# Patient Record
Sex: Female | Born: 1970 | Race: White | Hispanic: No | Marital: Married | Smoking: Current every day smoker
Health system: Southern US, Community
[De-identification: ages and names within clinical notes are randomized; demographics above are authoritative.]

## PROBLEM LIST (undated history)

## (undated) DIAGNOSIS — F419 Anxiety disorder, unspecified: Secondary | ICD-10-CM

## (undated) DIAGNOSIS — J449 Chronic obstructive pulmonary disease, unspecified: Secondary | ICD-10-CM

## (undated) DIAGNOSIS — J984 Other disorders of lung: Secondary | ICD-10-CM

## (undated) DIAGNOSIS — Z9981 Dependence on supplemental oxygen: Secondary | ICD-10-CM

## (undated) DIAGNOSIS — K254 Chronic or unspecified gastric ulcer with hemorrhage: Secondary | ICD-10-CM

## (undated) DIAGNOSIS — R0602 Shortness of breath: Secondary | ICD-10-CM

## (undated) DIAGNOSIS — D869 Sarcoidosis, unspecified: Secondary | ICD-10-CM

## (undated) HISTORY — PX: COLONOSCOPY: SHX174

## (undated) HISTORY — PX: LUNG BIOPSY: SHX232

---

## 2001-06-23 HISTORY — PX: ABDOMINAL HYSTERECTOMY: SHX81

## 2006-06-23 HISTORY — PX: BACK SURGERY: SHX140

## 2006-06-23 HISTORY — PX: TONSILLECTOMY: SUR1361

## 2011-06-24 DIAGNOSIS — K254 Chronic or unspecified gastric ulcer with hemorrhage: Secondary | ICD-10-CM

## 2011-06-24 HISTORY — DX: Chronic or unspecified gastric ulcer with hemorrhage: K25.4

## 2011-11-23 ENCOUNTER — Encounter (HOSPITAL_COMMUNITY): Payer: Self-pay | Admitting: *Deleted

## 2011-11-23 ENCOUNTER — Emergency Department (HOSPITAL_COMMUNITY): Payer: Medicaid Other

## 2011-11-23 ENCOUNTER — Emergency Department (HOSPITAL_COMMUNITY)
Admission: EM | Admit: 2011-11-23 | Discharge: 2011-11-23 | Disposition: A | Discharge status: Home / Self Care | Payer: Medicaid Other | Attending: Emergency Medicine | Admitting: Emergency Medicine

## 2011-11-23 DIAGNOSIS — R079 Chest pain, unspecified: Secondary | ICD-10-CM | POA: Insufficient documentation

## 2011-11-23 DIAGNOSIS — J984 Other disorders of lung: Secondary | ICD-10-CM | POA: Insufficient documentation

## 2011-11-23 DIAGNOSIS — Z9981 Dependence on supplemental oxygen: Secondary | ICD-10-CM | POA: Insufficient documentation

## 2011-11-23 DIAGNOSIS — J4489 Other specified chronic obstructive pulmonary disease: Secondary | ICD-10-CM | POA: Insufficient documentation

## 2011-11-23 DIAGNOSIS — J449 Chronic obstructive pulmonary disease, unspecified: Secondary | ICD-10-CM | POA: Insufficient documentation

## 2011-11-23 DIAGNOSIS — R0602 Shortness of breath: Secondary | ICD-10-CM | POA: Insufficient documentation

## 2011-11-23 DIAGNOSIS — D869 Sarcoidosis, unspecified: Secondary | ICD-10-CM | POA: Insufficient documentation

## 2011-11-23 HISTORY — DX: Sarcoidosis, unspecified: D86.9

## 2011-11-23 HISTORY — DX: Dependence on supplemental oxygen: Z99.81

## 2011-11-23 HISTORY — DX: Chronic obstructive pulmonary disease, unspecified: J44.9

## 2011-11-23 HISTORY — DX: Anxiety disorder, unspecified: F41.9

## 2011-11-23 HISTORY — DX: Other disorders of lung: J98.4

## 2011-11-23 LAB — DIFFERENTIAL
Basophils Relative: 0 % (ref 0–1)
Eosinophils Absolute: 0 10*3/uL (ref 0.0–0.7)
Eosinophils Relative: 0 % (ref 0–5)
Monocytes Relative: 3 % (ref 3–12)
Neutrophils Relative %: 84 % — ABNORMAL HIGH (ref 43–77)

## 2011-11-23 LAB — BASIC METABOLIC PANEL
BUN: 15 mg/dL (ref 6–23)
Calcium: 9.3 mg/dL (ref 8.4–10.5)
GFR calc Af Amer: >90 mL/min (ref >90)
GFR calc non Af Amer: >90 mL/min (ref >90)
Potassium: 4.3 mEq/L (ref 3.5–5.1)

## 2011-11-23 LAB — CBC
Hemoglobin: 13.2 g/dL (ref 12.0–15.0)
MCH: 31.7 pg (ref 26.0–34.0)
MCHC: 32.8 g/dL (ref 30.0–36.0)
Platelets: 522 10*3/uL — ABNORMAL HIGH (ref 150–400)

## 2011-11-23 MED ORDER — IPRATROPIUM BROMIDE 0.02 % IN SOLN
0.5000 mg | Freq: Once | RESPIRATORY_TRACT | Status: AC
  Administered 2011-11-23: 0.5 mg via RESPIRATORY_TRACT
  Filled 2011-11-23: qty 2.5

## 2011-11-23 MED ORDER — PREDNISONE 20 MG PO TABS
40.0000 mg | ORAL_TABLET | Freq: Once | ORAL | Status: AC
  Administered 2011-11-23: 40 mg via ORAL
  Filled 2011-11-23: qty 2

## 2011-11-23 MED ORDER — ALBUTEROL SULFATE (5 MG/ML) 0.5% IN NEBU
5.0000 mg | INHALATION_SOLUTION | Freq: Once | RESPIRATORY_TRACT | Status: AC
  Administered 2011-11-23: 5 mg via RESPIRATORY_TRACT
  Filled 2011-11-23: qty 1

## 2011-11-23 MED ORDER — SODIUM CHLORIDE 0.9 % IV SOLN
INTRAVENOUS | Status: DC
  Administered 2011-11-23: 17:00:00 via INTRAVENOUS

## 2011-11-23 MED ORDER — PREDNISONE 20 MG PO TABS: 60.0000 mg | ORAL_TABLET | Freq: Every day | ORAL | Status: DC

## 2011-11-23 NOTE — ED Notes (Signed)
Pt c/o cough and sob for a few days. Pt states she believes this is a flare up of her chronic lung condition.

## 2011-11-23 NOTE — Discharge Instructions (Signed)
Chronic Obstructive Pulmonary Disease Chronic obstructive pulmonary disease (COPD) is a condition in which airflow from the lungs is restricted. The lungs can never return to normal, but there are measures you can take which will improve them and make you feel better. CAUSES   Smoking.   Exposure to secondhand smoke.   Breathing in irritants (pollution, cigarette smoke, strong smells, aerosol sprays, paint fumes).   History of lung infections.  TREATMENT  Treatment focuses on making you comfortable (supportive care). Your caregiver may prescribe medications (inhaled or pills) to help improve your breathing. HOME CARE INSTRUCTIONS   If you smoke, stop smoking.   Avoid exposure to smoke, chemicals, and fumes that aggravate your breathing.   Take antibiotic medicines as directed by your caregiver.   Avoid medicines that dry up your system and slow down the elimination of secretions (antihistamines and cough syrups). This decreases respiratory capacity and may lead to infections.   Drink enough water and fluids to keep your urine clear or pale yellow. This loosens secretions.   Use humidifiers at home and at your bedside if they do not make breathing difficult.   Receive all protective vaccines your caregiver suggests, especially pneumococcal and influenza.   Use home oxygen as suggested.   Stay active. Exercise and physical activity will help maintain your ability to do things you want to do.   Eat a healthy diet.  SEEK MEDICAL CARE IF:   You develop pus-like mucus (sputum).   Breathing is more labored or exercise becomes difficult to do.   You are running out of the medicine you take for your breathing.  SEEK IMMEDIATE MEDICAL CARE IF:   You have a rapid heart rate.   You have agitation, confusion, tremors, or are in a stupor (family members may need to observe this).   It becomes difficult to breathe.   You develop chest pain.   You have a fever.  MAKE SURE YOU:    Understand these instructions.   Will watch your condition.   Will get help right away if you are not doing well or get worse.  Document Released: 03/19/2005 Document Revised: 05/29/2011 Document Reviewed: 08/09/2010 [facility] Patient Information 2012 [place], [place].Sarcoidosis, Schaumann's Disease, Sarcoid of [place] Sarcoidosis appears briefly and heals naturally in 60 to 70 percent of cases, often without the patient knowing or doing anything about it. 20 to 30 percent of patients with sarcoidosis are left with some permanent lung damage. In 10 to 15 percent of the patients, sarcoidosis can become chronic (long lasting). When either the granulomas or fibrosis seriously affect the function of a vital organ (lungs, heart, nervous system, liver, or kidneys), sarcoidosis can be fatal. This occurs 5 to 10 percent of the time. No one can predict how sarcoidosis will progress in an individual patient. The symptoms the patient experiences, the caregiver's findings, and the patient's race can give some clues. Sarcoidosis was once considered a rare disease. We now know that it is a common chronic illness that appears all over the world. It is the most common of the fibrotic (scarring) lung disorders. Anyone can get sarcoidosis. It occurs in all races and in both sexes. The risk is greater if you are a young black adult, especially a black woman, or are of [place], [place], [place], or [place] origin. In sarcoidosis, small lumps (also called nodules or granulomas) develop in multiple organs of the body. These granulomas are small collections of inflamed cells. They commonly appear in the lungs. This  is the most common organ affected. They also occur in the lymph nodes (your glands), skin, liver, and eyes. The granulomas vary in the amount of disease they produce from very little with no problems (symptoms) to causing severe illness. The cause of sarcoidosis is not known. It may be due to an abnormal immune  reaction in the body. Most people will recover. A few people will develop long lasting conditions that may get worse. Women are affected more often than men. The majority of those affected are under forty years of age. Because we do not know the cause, we do not have ways to prevent it. SYMPTOMS   Fever.   Loss of appetite.   Night sweats.   Joint pain.   Aching muscles  Symptoms vary because the disease affects different parts of the body in different people. Most people who see their caregiver with sarcoidosis have lung problems. The first signs are usually a dry cough and shortness of breath. There may also be wheezing, chest pain, or a cough that brings up bloody mucus. In severe cases, lung function may become so poor that the person cannot perform even the simple routine tasks of daily life. Other symptoms of sarcoidosis are less common than lung symptoms. They can include:  Skin symptoms. Sarcoidosis can appear as a collection of tender, red bumps called erythema nodosum. These bumps usually occur on the face, shins, and arms. They can also occur as a scaly, purplish discoloration on the nose, cheeks, and ears. This is called lupus pernio. Less often, sarcoidosis causes cysts, pimples, or disfiguring over growths of skin. In many cases, the disfiguring over growths develop in areas of scars or tattoos.   Eye symptoms. These include redness, eye pain, and sensitivity to light.   Heart symptoms. These include irregular heartbeat and heart failure.   Other symptoms. A person may have paralyzed facial muscles, seizures, psychiatric symptoms, swollen salivary glands, or bone pain.  DIAGNOSIS  Even when there are no symptoms, your caregiver can sometimes pick up signs of sarcoidosis during a routine examination, usually through a chest x-ray or when checking other complaints. The patient's age and race or ethnic group can raise an additional red flag that a sign or symptom could be related to  sarcoidosis.   Enlargement of the salivary or tear glands and cysts in bone tissue may also be caused by sarcoidosis.   You may have had a biopsy done that shows signs of sarcoidosis. A biopsy is a small tissue sample that is removed for laboratory testing. This tissue sample can be taken from your lung, skin, lip, or another inflamed or abnormal area of the body.   You may have had an abnormal chest X-ray. Although you appear healthy, a chest X-ray ordered for other reasons may turn up abnormalities that suggest sarcoidosis.   Other tests may be needed. These tests may be done to rule out other illnesses or to determine the amount of organ damage caused by sarcoidosis. Some of the most common tests are:   Blood levels of calcium or angiotensin-converting enzyme may be high in people with sarcoidosis.   Blood tests to evaluate how well your liver is functioning.   Lung function tests to measure how well you are breathing.   A complete eye examination.  TREATMENT  If sarcoidosis does not cause any problems, treatment may not be necessary. Your caregiver may decide to simply monitor your condition. As part of this monitoring process, you may have  frequent office visits, follow-up chest X-rays, and tests of your lung function.If you have signs of moderate or severe lung disease, your doctor may recommend:  A corticosteroid drug, such as prednisone (sold under several brand names).   Corticosteroids also are used to treat sarcoidosis of the eyes, joints, skin, nerves, or heart.   Corticosteroid eye drops may be used for the eyes.   Over-the-counter medications like nonsteroidal anti-inflammatory drugs (NSAID) often are used to treat joint pain first before corticosteroids, which tend to have more side effects.   If corticosteroids are not effective or cause serious side effects, other drugs that alter or suppress the immune system may be used.   In rare cases, when sarcoidosis causes  life-threatening lung disease, a lung transplant may be necessary. However, there is some risk that the new lungs also will be attacked by sarcoidosis.  SEEK IMMEDIATE MEDICAL CARE IF:   You suffer from shortness of breath or a lingering cough.   You develop new problems that may be related to the disease. Remember this disease can affect almost all organs of the body and cause many different problems.  Document Released: 04/09/2004 Document Revised: 05/29/2011 Document Reviewed: 09/17/2005 [facility] Patient Information 2012 [place], [place].

## 2011-11-23 NOTE — ED Provider Notes (Signed)
History    This chart was scribed for [employer]. Rubin Payor, MD, MD by Smitty Pluck. The patient was seen in room APA05 and the patient's care was started at 3:53PM.   CSN: [id number]  Arrival date & time 11/23/11  1458   First MD Initiated Contact with Patient 11/23/11 1544      Chief Complaint  Patient presents with  . Shortness of Breath    (Consider location/radiation/quality/duration/timing/severity/associated sxs/prior treatment) Patient is a 41 y.o. female presenting with shortness of breath. The history is provided by the patient.  Shortness of Breath  Associated symptoms include chest pain and shortness of breath.   Laura Haynes is a 41 y.o. female who presents to the Emergency Department complaining of moderate chest pain and SOB onset 4 days ago. Pt reports laying on her right side aggravates the pain. Denies fever, and rash. Reports hx of lung biopsy and respiratory infections. She use to go to [facility] for lung treatments. Denies PE. Symptoms have been constant since onset without radiation.  Past Medical History  Diagnosis Date  . Sarcoidosis   . 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 COPD (chronic obstructive pulmonary disease)   . Asthma   . Anxiety   . Oxygen dependent     prn basis    Past Surgical History  Procedure Date  . Cesarean section   . Lung biopsy   . Tonsillectomy   . Back surgery   . Abdominal hysterectomy     No family history on file.  History  Substance Use Topics  . Smoking status: Current Everyday Smoker  . Smokeless tobacco: Not on file  . Alcohol Use: No    OB History    Grav Para Term Preterm Abortions TAB SAB Ect Mult Living                  Review of Systems  Constitutional: Negative for diaphoresis.  Respiratory: Positive for shortness of breath.   Cardiovascular: Positive for chest pain.  Gastrointestinal: Negative for nausea and vomiting.    Allergies  Coreg; Darvocet; Morphine and  related; Motrin; and Phenobarbital  Home Medications   Current Outpatient Rx  Name Route Sig Dispense Refill  . ALPRAZOLAM 1 MG PO TABS Oral Take 1 mg by mouth 3 (three) times daily.    . FENTANYL 50 MCG/HR TD PT72 Transdermal Place 1 patch onto the skin every other day.    Marland Kitchen FLUOROMETHOLONE 0.1 % OP SUSP Both Eyes Place 1 drop into both eyes 2 (two) times daily as needed.    Marland Kitchen OMEPRAZOLE 40 MG PO CPDR Oral Take 40 mg by mouth 2 (two) times daily.    . OXYCODONE-ACETAMINOPHEN 7.5-325 MG PO TABS Oral Take 1 tablet by mouth 2 (two) times daily as needed. pain    . POLYVINYL ALCOHOL-POVIDONE 1.4-0.6 % OP SOLN Both Eyes Place 1-2 drops into both eyes as needed. Dry eyes    . PREDNISONE 20 MG PO TABS Oral Take 20 mg by mouth daily.    Marland Kitchen PREDNISONE 20 MG PO TABS Oral Take 3 tablets (60 mg total) by mouth daily. Take instead of your 20mg  for 3 days 60 tablet 0    BP 121/78  Pulse 68  Temp(Src) 98.4 F (36.9 C) (Oral)  Resp 20  Ht 5\' 8"  (1.727 m)  Wt 160 lb (72.576 kg)  BMI 24.33 kg/m2  SpO2 95%  Physical Exam  Nursing note and  vitals reviewed. Constitutional: He is oriented to person, place, and time. He appears well-developed and well-nourished. No distress.  HENT:  Head: Normocephalic and atraumatic.  Eyes: Conjunctivae are normal. Pupils are equal, round, and reactive to light.  Pulmonary/Chest: Effort normal and breath sounds normal. No respiratory distress.       sounds harsh   Musculoskeletal: He exhibits no edema.       fentanyl patch on right scapula area  Erythema near patch from reaction to adhesive from previous patches   Neurological: He is alert and oriented to person, place, and time.  Skin: Skin is warm and dry.  Psychiatric: He has a normal mood and affect. His behavior is normal.    ED Course  Procedures (including critical care time) DIAGNOSTIC STUDIES: Oxygen Saturation is 95% on room air, normal by my interpretation.    COORDINATION OF CARE: 4:00PM EDP  discusses pt ED treatment with pt  4:15PM EDP ordered medication: 0.9% NaCl infusion 6:41PM EDP rechecks pt and discusses pt lab results. Pt is ready for discharge.  Labs Reviewed  CBC - Abnormal; Notable for the following:    WBC 16.6 (*)    Platelets 522 (*)    All other components within normal limits  DIFFERENTIAL - Abnormal; Notable for the following:    Neutrophils Relative 84 (*)    Neutro Abs 13.9 (*)    All other components within normal limits  BASIC METABOLIC PANEL - Abnormal; Notable for the following:    Glucose, Bld 135 (*)    All other components within normal limits  TROPONIN I   Dg Chest 2 View  11/23/2011  *RADIOLOGY REPORT*  Clinical Data: Shortness of breath.  History of sarcoidosis.  CHEST - 2 VIEW  Comparison: None.  Findings: Cardiac and mediastinal contours are unremarkable.  Lungs are clear.  No pneumothorax or pleural fluid.  IMPRESSION: No acute finding.  Original Report Authenticated By: Bernadene Bell. D'ALESSIO, M.D.     1. COPD (chronic obstructive pulmonary disease)   2. Sarcoidosis       MDM  Patient is had cough and shortness of breath the last few days. She's a history of COPD and sarcoidosis. She states she's had trouble breathing at home. Lab work and x-ray reassuring. She's not hypoxic with ambulation. White count is elevated, however she is on steroids. Patient's prednisone will be increased and she'll followup with her pulmonologist in the next couple days. I personally performed the services described in this documentation, which was scribed in my presence. The recorded information has been reviewed and considered.          Juliet Rude. Rubin Payor, MD 11/23/11 1901

## 2011-11-23 NOTE — ED Notes (Addendum)
Pt c/o sob, cough, non productive, denies any fever, pt states that the sob is worse when she lays on her right side, pt also c/o pain chest pain. Pt has sarcoidosis and has chronic chest pain but states that this chest pain is not relieved by her pain medication that she usually has at home, pain  is located in center of chest and to the right of chest.

## 2011-11-23 NOTE — ED Notes (Signed)
Patient ambulated around E.R.  O2 sat maintained at 94%.

## 2012-03-23 HISTORY — PX: ESOPHAGOGASTRODUODENOSCOPY: SHX1529

## 2012-04-07 ENCOUNTER — Inpatient Hospital Stay: Payer: Self-pay | Admitting: Internal Medicine

## 2012-04-07 LAB — COMPREHENSIVE METABOLIC PANEL
Albumin: 3.8 g/dL (ref 3.4–5.0)
Alkaline Phosphatase: 106 U/L (ref 50–136)
Bilirubin,Total: 0.2 mg/dL (ref 0.2–1.0)
Chloride: 106 mmol/L (ref 98–107)
Creatinine: 0.61 mg/dL (ref 0.60–1.30)
EGFR (African American): >60
Osmolality: 289 (ref 275–301)
SGOT(AST): 12 U/L — ABNORMAL LOW (ref 15–37)
SGPT (ALT): 11 U/L — ABNORMAL LOW (ref 12–78)
Total Protein: 6.9 g/dL (ref 6.4–8.2)

## 2012-04-07 LAB — CBC WITH DIFFERENTIAL/PLATELET
Basophil #: 0.1 10*3/uL (ref 0.0–0.1)
Basophil %: 0.6 %
Eosinophil #: 0.1 10*3/uL (ref 0.0–0.7)
MCH: 28.7 pg (ref 26.0–34.0)
MCHC: 32.6 g/dL (ref 32.0–36.0)
Monocyte #: 0.4 x10 3/mm (ref 0.2–0.9)
Neutrophil %: 81.9 %
Platelet: 448 10*3/uL — ABNORMAL HIGH (ref 150–440)
RBC: 4.06 10*6/uL (ref 3.80–5.20)

## 2012-04-07 LAB — PROTIME-INR: INR: 1

## 2012-04-07 LAB — LIPASE, BLOOD: Lipase: 134 U/L (ref 73–393)

## 2012-04-07 LAB — HEMOGLOBIN: HGB: 10.4 g/dL — ABNORMAL LOW (ref 12.0–16.0)

## 2012-04-08 LAB — CBC WITH DIFFERENTIAL/PLATELET
Basophil #: 0.1 10*3/uL (ref 0.0–0.1)
Basophil %: 0.4 %
Eosinophil #: 0 10*3/uL (ref 0.0–0.7)
HGB: 8.7 g/dL — ABNORMAL LOW (ref 12.0–16.0)
Lymphocyte #: 2.2 10*3/uL (ref 1.0–3.6)
Lymphocyte %: 16.8 %
MCV: 89 fL (ref 80–100)
Neutrophil %: 79.3 %
RBC: 3.05 10*6/uL — ABNORMAL LOW (ref 3.80–5.20)
WBC: 13.1 10*3/uL — ABNORMAL HIGH (ref 3.6–11.0)

## 2012-04-08 LAB — COMPREHENSIVE METABOLIC PANEL
Alkaline Phosphatase: 77 U/L (ref 50–136)
BUN: 12 mg/dL (ref 7–18)
Calcium, Total: 8 mg/dL — ABNORMAL LOW (ref 8.5–10.1)
Chloride: 112 mmol/L — ABNORMAL HIGH (ref 98–107)
Co2: 24 mmol/L (ref 21–32)
EGFR (African American): >60
EGFR (Non-African Amer.): >60
Glucose: 128 mg/dL — ABNORMAL HIGH (ref 65–99)
SGOT(AST): 10 U/L — ABNORMAL LOW (ref 15–37)
SGPT (ALT): 10 U/L — ABNORMAL LOW (ref 12–78)
Total Protein: 5.4 g/dL — ABNORMAL LOW (ref 6.4–8.2)

## 2012-04-09 LAB — CBC WITH DIFFERENTIAL/PLATELET
Basophil #: 0.1 10*3/uL (ref 0.0–0.1)
Eosinophil #: 0 10*3/uL (ref 0.0–0.7)
Lymphocyte #: 1.9 10*3/uL (ref 1.0–3.6)
MCV: 89 fL (ref 80–100)
Monocyte %: 2.4 %
Neutrophil #: 11.1 10*3/uL — ABNORMAL HIGH (ref 1.4–6.5)
Neutrophil %: 82.9 %
Platelet: 335 10*3/uL (ref 150–440)
RBC: 3.12 10*6/uL — ABNORMAL LOW (ref 3.80–5.20)
RDW: 15.1 % — ABNORMAL HIGH (ref 11.5–14.5)
WBC: 13.4 10*3/uL — ABNORMAL HIGH (ref 3.6–11.0)

## 2012-04-10 LAB — CBC WITH DIFFERENTIAL/PLATELET
Basophil %: 0.2 %
Eosinophil #: 0 10*3/uL (ref 0.0–0.7)
Eosinophil %: 0 %
HCT: 25.1 % — ABNORMAL LOW (ref 35.0–47.0)
HGB: 8.1 g/dL — ABNORMAL LOW (ref 12.0–16.0)
Lymphocyte #: 2 10*3/uL (ref 1.0–3.6)
MCHC: 32.4 g/dL (ref 32.0–36.0)
MCV: 89 fL (ref 80–100)
Monocyte %: 2.7 %
Neutrophil %: 80.4 %
Platelet: 320 10*3/uL (ref 150–440)
RBC: 2.82 10*6/uL — ABNORMAL LOW (ref 3.80–5.20)
WBC: 12 10*3/uL — ABNORMAL HIGH (ref 3.6–11.0)

## 2012-04-13 LAB — CULTURE, BLOOD (SINGLE)

## 2012-06-23 HISTORY — PX: ESOPHAGOGASTRODUODENOSCOPY: SHX1529

## 2012-07-15 ENCOUNTER — Ambulatory Visit: Payer: Self-pay | Admitting: Gastroenterology

## 2012-07-16 LAB — PATHOLOGY REPORT

## 2012-08-21 HISTORY — PX: ESOPHAGOGASTRODUODENOSCOPY: SHX1529

## 2012-09-09 ENCOUNTER — Ambulatory Visit: Payer: Self-pay | Admitting: Gastroenterology

## 2012-09-10 LAB — PATHOLOGY REPORT

## 2013-03-02 ENCOUNTER — Telehealth: Payer: Self-pay | Admitting: Gastroenterology

## 2013-03-02 NOTE — Telephone Encounter (Signed)
Pt is scheduled to see LSL on 9/23 and is upset that she can't be seen sooner. She is a new patient to [place] and said that she is losing weight and can't eat. I told her that I do not have anything any sooner right now because I am having to [facility] a day's worth of patients right now. I offered to put her on a cancellation list, but she said she has waited long enough and she is an URG situation and needs to be seen. I told her that I would have the nurse call her. [phone]

## 2013-03-02 NOTE — Telephone Encounter (Signed)
I called pt. She said she is a sarcoid pt and she still sees the PCP at [facility], because they understand her condition. She was referred here about a month ago and scheduled an OV with LL on 03/15/2013/ She said she really does need to be seen before then. Said she has lost 10 more pounds since the referral. ( She was referred for gastric ulcer and esophageal reflux). Said she can't ear hardly anything, but she is drinking some boost. Per AS ok to schedule in an urgent spot.

## 2013-03-02 NOTE — Telephone Encounter (Signed)
Tobi Bastos was full for tomorrow, ov scheduled with Tana Coast PA for 03/03/2013 at 2:30 pm. [facility] for pt. The appt date and time.

## 2013-03-03 ENCOUNTER — Encounter: Payer: Self-pay | Admitting: Gastroenterology

## 2013-03-03 ENCOUNTER — Ambulatory Visit (INDEPENDENT_AMBULATORY_CARE_PROVIDER_SITE_OTHER): Payer: BC Managed Care – PPO | Admitting: Gastroenterology

## 2013-03-03 VITALS — BP 112/74 | HR 86 | Temp 97.2°F | Wt 137.2 lb

## 2013-03-03 DIAGNOSIS — R1314 Dysphagia, pharyngoesophageal phase: Secondary | ICD-10-CM

## 2013-03-03 DIAGNOSIS — R634 Abnormal weight loss: Secondary | ICD-10-CM

## 2013-03-03 DIAGNOSIS — Z8711 Personal history of peptic ulcer disease: Secondary | ICD-10-CM

## 2013-03-03 DIAGNOSIS — R1319 Other dysphagia: Secondary | ICD-10-CM

## 2013-03-03 DIAGNOSIS — R1310 Dysphagia, unspecified: Secondary | ICD-10-CM

## 2013-03-03 NOTE — Patient Instructions (Signed)
Once we review your records, we will make further recommendations.

## 2013-03-03 NOTE — Progress Notes (Addendum)
Primary Care Physician:  Erskine Speed, FNP-C/[facility]  Primary Gastroenterologist:  Roetta Sessions, MD   Chief Complaint  Patient presents with  . Gastrophageal Reflux  . Dysphagia    HPI:  Laura Haynes is a 42 y.o. female here for further evaluation of abnormal weight loss, dysphagia. Diagnosed with bleeding PUD in 03/2012. Hospitalized at [facility] at that time. EGD showed ulcer (Dr. Lutricia Feil per patient) Started on Dexilant at that time. Never had abdominal pain. Presented with hematemesis. States she was given no explanation for the ulcer. States she has had total of three EGDs and had persistent ulcer.   Currently denies n/v. No heartburn. No abdominal pain. Cannot swallow even water. Struggles to get any food down. States she is hungry but just can't swallow. Discomfort in substernal region with meals.  BM 1-2 per week. Not hard stools or straining. No brbpr, melena. Feels very weak. Weight down 50 pounds since 03/2012. Denies problems taking her pills.     Current Outpatient Prescriptions  Medication Sig Dispense Refill  . albuterol (PROVENTIL) (2.5 MG/3ML) 0.083% nebulizer solution Take 2.5 mg by nebulization every 6 (six) hours as needed for wheezing.      Marland Kitchen ALPRAZolam (XANAX) 1 MG tablet Take 1 mg by mouth 3 (three) times daily.      Marland Kitchen dexlansoprazole (DEXILANT) 60 MG capsule Take 60 mg by mouth daily.      . fentaNYL (DURAGESIC - DOSED MCG/HR) 50 MCG/HR Place 1 patch onto the skin every other day.      . fluorometholone (FML) 0.1 % ophthalmic suspension Place 1 drop into both eyes 2 (two) times daily as needed.      . NON FORMULARY Albuterol Inhaler as needed      . oxyCODONE-acetaminophen (PERCOCET) 7.5-325 MG per tablet Take 1 tablet by mouth 2 (two) times daily as needed. pain      . polyvinyl alcohol-povidone (HYPOTEARS) 1.4-0.6 % ophthalmic solution Place 1-2 drops into both eyes as needed. Dry eyes      . predniSONE (DELTASONE) 20 MG tablet Take 10  mg by mouth daily. Taking 10 mg daily      . sucralfate (CARAFATE) 1 G tablet Take 1 g by mouth 4 (four) times daily.       No current facility-administered medications for this visit.    Allergies as of 03/03/2013 - Review Complete 03/03/2013  Allergen Reaction Noted  . Coreg [carvedilol]  11/23/2011  . Darvocet [propoxyphene-acetaminophen]  11/23/2011  . Morphine and related  11/23/2011  . Motrin [ibuprofen]  11/23/2011  . Phenobarbital Other (See Comments) 11/23/2011    Past Medical History  Diagnosis Date  . Sarcoidosis   . 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 COPD (chronic obstructive pulmonary disease)   . Asthma   . Anxiety   . Oxygen dependent     prn basis    Past Surgical History  Procedure Laterality Date  . Cesarean section  1993  . Lung biopsy  [id number]  . Tonsillectomy  2008  . Back surgery  2008  . Abdominal hysterectomy  2003  . Esophagogastroduodenoscopy  March 2014    Dr. Renae Fickle Oh: 1 crater gastric ulcer in the antrum. Esophagus normal. Biopsying benign, no H. pylori.  . Colonoscopy      age 77, 32. normal per patient  . Esophagogastroduodenoscopy  January 2014    Dr. Renae Fickle Oh: Gastric ulcer, normal esophagus, biopsies benign,  no H. pylori  . Esophagogastroduodenoscopy  October 2013    Dr. Renae Fickle Oh: Large antral ulcer with visible vessel injected with epinephrine and cauterized. The official report/path unavailable    Family History  Problem Relation Age of Onset  . Colon cancer Maternal Aunt     greater than age 11  . Liver disease Neg Hx   . Breast cancer Mother   . Lung cancer Father     History   Social History  . Marital Status: Married    Spouse Name: N/A    Number of Children: 1  . Years of Education: N/A   Occupational History  . disability    Social History Main Topics  . Smoking status: Current Every Day Smoker  . Smokeless tobacco: Not on file     Comment: 1/2 pack a day  . Alcohol  Use: No  . Drug Use: No  . Sexual Activity: Not on file   Other Topics Concern  . Not on file   Social History Narrative  . No narrative on file      ROS:  General: Negative for anorexia, fever, chills, fatigue. C/O weight loss and weakness. Eyes: Negative for vision changes.  ENT: Negative for hoarseness,  nasal congestion. CV: Negative for chest pain, angina, palpitations, dyspnea on exertion, peripheral edema.  Respiratory: Negative for dyspnea at rest, dyspnea on exertion, cough, sputum, wheezing.  GI: See history of present illness. GU:  Negative for dysuria, hematuria, urinary incontinence, urinary frequency, nocturnal urination.  MS: Negative for joint pain, low back pain. Chronic pain/sarcoid. Derm: Negative for rash or itching.  Neuro: Negative for weakness, abnormal sensation, seizure, frequent headaches, memory loss, confusion.  Psych: Negative for anxiety, depression, suicidal ideation, hallucinations.  Endo: see hpi  Heme: Negative for bruising or bleeding. Allergy: Negative for rash or hives.    Physical Examination:  BP 112/74  Pulse 86  Temp(Src) 97.2 F (36.2 C) (Oral)  Wt 137 lb 3.2 oz (62.234 kg)  BMI 20.87 kg/m2   General: Well-nourished, well-developed in no acute distress. Accompanied by spouse. Appears older than stated age. Head: Normocephalic, atraumatic.   Eyes: Conjunctiva pink, no icterus. Mouth: Oropharyngeal mucosa moist and pink , no lesions erythema or exudate. Neck: Supple without thyromegaly, masses, or lymphadenopathy.  Lungs: Clear to auscultation bilaterally.  Heart: Regular rate and rhythm, no murmurs rubs or gallops.  Abdomen: Bowel sounds are normal, nontender, nondistended, no hepatosplenomegaly or masses, no abdominal bruits or    hernia , no rebound or guarding.   Rectal: not performed Extremities: No lower extremity edema. No clubbing or deformities.  Neuro: Alert and oriented x 4 , grossly normal neurologically.  Skin:  Warm and dry, no rash or jaundice.   Psych: Alert and cooperative, normal mood and affect.

## 2013-03-03 NOTE — Assessment & Plan Note (Signed)
42 y/o female with h/o persistent PUD, weight loss who presents with difficulty swallowing. She reports 3 EGDs since 03/2012 when she presented with hematemesis secondary to PUD. We have requested records. She denies having esophageal dilation. States she cannot eat do to inability to swallow. Food sticking in chest/resulting in pain. No n/v. No abdominal pain. Appetite good.  Obtain records. Continue PPI. Continue soft diet.  Further recommendations to follow once records reviewed.

## 2013-03-03 NOTE — Progress Notes (Signed)
NO PCP

## 2013-03-03 NOTE — Progress Notes (Signed)
Records received. PSH updated.

## 2013-03-04 NOTE — Addendum Note (Signed)
Addended by: Tiffany Kocher on: 03/04/2013 08:55 AM   Modules accepted: Orders

## 2013-03-04 NOTE — Progress Notes (Signed)
Patient is scheduled for BPE on Tuesday Sept 16th at 9:00 am and she is aware

## 2013-03-04 NOTE — Progress Notes (Signed)
Discussed with Dr. Jena Gauss. Reviewed all records.  Barium pill esophagram is next step to evaluate swallowing difficulties. Please make arrangement.

## 2013-03-08 ENCOUNTER — Ambulatory Visit (HOSPITAL_COMMUNITY)
Admission: RE | Admit: 2013-03-08 | Discharge: 2013-03-08 | Disposition: A | Discharge status: Home / Self Care | Payer: Medicaid Other | Source: Ambulatory Visit | Attending: Gastroenterology | Admitting: Gastroenterology

## 2013-03-08 DIAGNOSIS — R634 Abnormal weight loss: Secondary | ICD-10-CM

## 2013-03-08 DIAGNOSIS — R1310 Dysphagia, unspecified: Secondary | ICD-10-CM

## 2013-03-08 DIAGNOSIS — Z8711 Personal history of peptic ulcer disease: Secondary | ICD-10-CM

## 2013-03-15 ENCOUNTER — Ambulatory Visit: Payer: Medicaid Other | Admitting: Gastroenterology

## 2013-03-18 ENCOUNTER — Telehealth: Payer: Self-pay | Admitting: Gastroenterology

## 2013-03-18 NOTE — Telephone Encounter (Signed)
Message copied by Tiffany Kocher on Fri Mar 18, 2013 10:21 AM ------      Message from: Myra Rude      Created: Thu Mar 17, 2013  8:10 AM       Pt called yesterday after you left asking for her esophagram results. Informed her that the report said it was normal and if you had any further recommendations that we would call her. She said she stopped taking her pain pills and she does have pain in the upper part of her stomach. ------

## 2013-03-18 NOTE — Telephone Encounter (Signed)
Please see result note 

## 2013-03-18 NOTE — Progress Notes (Signed)
Quick Note:  Barium esophagram unremarkable except for patient experienced episode of vomiting during the procedure. No evidence of stricture. Patient has had total of 3 endoscopies since October 2013 by Dr. [facility]. She had a persisting gastric ulcer on her last endoscopy in March. She is on chronic prednisone.  Patient recently called in stating that she was having abdominal pain off of pain medication. Our discuss further with Dr. Jena Gauss. She may need to have repeat upper endoscopy to reevaluate her gastric ulcer. Further recommendations to follow. ______

## 2013-04-04 ENCOUNTER — Other Ambulatory Visit: Payer: Self-pay

## 2013-04-04 ENCOUNTER — Telehealth: Payer: Self-pay | Admitting: Gastroenterology

## 2013-04-04 DIAGNOSIS — K259 Gastric ulcer, unspecified as acute or chronic, without hemorrhage or perforation: Secondary | ICD-10-CM

## 2013-04-04 NOTE — Telephone Encounter (Signed)
Forwarding to Doris for Triage 

## 2013-04-04 NOTE — Telephone Encounter (Signed)
Discussed with Dr. Jena Gauss. Prior EGDs done with [facility]. He wants to do EGD with Phenergan 25mg  IV 30 minutes before procedure. OK to schedule.

## 2013-04-04 NOTE — Telephone Encounter (Signed)
Gastroenterology Pre-Procedure Review  Request Date:04/04/2013 Requesting Physician: Dr. Jena Gauss PATIENT REVIEW QUESTIONS: The patient responded to the following health history questions as indicated:    1. Diabetes Melitis: no 2. Joint replacements in the past 12 months: no 3. Major health problems in the past 3 months: no 4. Has an artificial valve or MVP: no 5. Has a defibrillator: no 6. Has been advised in past to take antibiotics in advance of a procedure like teeth cleaning: no    MEDICATIONS & ALLERGIES:    Patient reports the following regarding taking any blood thinners:   Plavix? no Aspirin? no Coumadin? no  Patient confirms/reports the following medications:  Current Outpatient Prescriptions  Medication Sig Dispense Refill  . albuterol (PROVENTIL) (2.5 MG/3ML) 0.083% nebulizer solution Take 2.5 mg by nebulization every 6 (six) hours as needed for wheezing.      Marland Kitchen ALPRAZolam (XANAX) 1 MG tablet Take 1 mg by mouth 3 (three) times daily. As needed      . dexlansoprazole (DEXILANT) 60 MG capsule Take 60 mg by mouth daily.      . fentaNYL (DURAGESIC - DOSED MCG/HR) 50 MCG/HR Place 1 patch onto the skin every other day.      . fluorometholone (FML) 0.1 % ophthalmic suspension Place 1 drop into both eyes 2 (two) times daily as needed.      Marland Kitchen ketotifen (ZADITOR) 0.025 % ophthalmic solution 1 drop 2 (two) times daily.      . NON FORMULARY Albuterol Inhaler as needed      . oxyCODONE-acetaminophen (PERCOCET) 7.5-325 MG per tablet Take 1 tablet by mouth 2 (two) times daily as needed. pain      . polyvinyl alcohol-povidone (HYPOTEARS) 1.4-0.6 % ophthalmic solution Place 1-2 drops into both eyes as needed. Dry eyes      . predniSONE (DELTASONE) 20 MG tablet Take 10 mg by mouth daily. Taking 10 mg daily      . sucralfate (CARAFATE) 1 G tablet Take 1 g by mouth 4 (four) times daily.       No current facility-administered medications for this visit.    Patient confirms/reports the  following allergies:  Allergies  Allergen Reactions  . Coreg [Carvedilol]     Hair loss/mood swings  . Darvocet [Propoxyphene-Acetaminophen]     blisters  . Morphine And Related     blisters  . Motrin [Ibuprofen]     blisters  . Phenobarbital Other (See Comments)    Child hood allergy    No orders of the defined types were placed in this encounter.    AUTHORIZATION INFORMATION Primary Insurance: ,  ID #: ,  Group #:  Pre-Cert / Auth required: Pre-Cert / Auth #:   Secondary Insurance:   ID #:  Group #:   Pre-Cert / Auth #:   SCHEDULE INFORMATION: Procedure has been scheduled as follows:  Date: 04/13/2013                   Time:  1:15 PM Location: [facility] Short Stay  This Gastroenterology Pre-Precedure Review Form is being routed to the following provider(s): R. Roetta Sessions, MD

## 2013-04-04 NOTE — Telephone Encounter (Signed)
Please let patient know, per Dr. Jena Gauss, she needs another EGD. Her ulcer was still present on her last EGD in [place] and patient continues to have abdominal pain.   Please arrange for EGD with RMR as soon as possible. Needs Phenergan 25 mg IV 30 mins before for polypharmacy. Please triage.

## 2013-04-05 NOTE — Telephone Encounter (Signed)
Instructions were mailed to pt

## 2013-04-06 ENCOUNTER — Encounter (HOSPITAL_COMMUNITY): Payer: Self-pay | Admitting: Pharmacy Technician

## 2013-04-11 ENCOUNTER — Telehealth: Payer: Self-pay | Admitting: Internal Medicine

## 2013-04-11 NOTE — Telephone Encounter (Signed)
Pt called that she needed a refill on her Carafate RX and to call it in to [facility].

## 2013-04-11 NOTE — Telephone Encounter (Signed)
Routing to refill box  

## 2013-04-12 MED ORDER — SUCRALFATE 1 G PO TABS: 1.0000 g | ORAL_TABLET | Freq: Four times a day (QID) | ORAL | Status: DC

## 2013-04-12 NOTE — Addendum Note (Signed)
Addended by: Tiffany Kocher on: 04/12/2013 10:47 AM   Modules accepted: Orders

## 2013-04-12 NOTE — Telephone Encounter (Signed)
done

## 2013-04-13 ENCOUNTER — Encounter (HOSPITAL_COMMUNITY)
Admission: RE | Disposition: A | Discharge status: Home / Self Care | Payer: Self-pay | Source: Ambulatory Visit | Attending: Internal Medicine

## 2013-04-13 ENCOUNTER — Ambulatory Visit (HOSPITAL_COMMUNITY)
Admission: RE | Admit: 2013-04-13 | Discharge: 2013-04-13 | Disposition: A | Discharge status: Home / Self Care | Payer: Medicaid Other | Source: Ambulatory Visit | Attending: Internal Medicine | Admitting: Internal Medicine

## 2013-04-13 ENCOUNTER — Encounter (HOSPITAL_COMMUNITY): Payer: Self-pay | Admitting: *Deleted

## 2013-04-13 DIAGNOSIS — K3189 Other diseases of stomach and duodenum: Secondary | ICD-10-CM

## 2013-04-13 DIAGNOSIS — K259 Gastric ulcer, unspecified as acute or chronic, without hemorrhage or perforation: Secondary | ICD-10-CM

## 2013-04-13 DIAGNOSIS — R1013 Epigastric pain: Secondary | ICD-10-CM

## 2013-04-13 DIAGNOSIS — R1310 Dysphagia, unspecified: Secondary | ICD-10-CM | POA: Insufficient documentation

## 2013-04-13 DIAGNOSIS — Z8711 Personal history of peptic ulcer disease: Secondary | ICD-10-CM

## 2013-04-13 HISTORY — PX: ESOPHAGOGASTRODUODENOSCOPY (EGD) WITH ESOPHAGEAL DILATION: SHX5812

## 2013-04-13 SURGERY — ESOPHAGOGASTRODUODENOSCOPY (EGD) WITH ESOPHAGEAL DILATION
Anesthesia: Moderate Sedation

## 2013-04-13 MED ORDER — MEPERIDINE HCL 100 MG/ML IJ SOLN
INTRAMUSCULAR | Status: DC | PRN
  Administered 2013-04-13 (×2): 50 mg via INTRAVENOUS
  Administered 2013-04-13: 25 mg via INTRAVENOUS
  Administered 2013-04-13: 50 mg via INTRAVENOUS

## 2013-04-13 MED ORDER — ONDANSETRON HCL 4 MG/2ML IJ SOLN
INTRAMUSCULAR | Status: DC | PRN
  Administered 2013-04-13: 4 mg via INTRAVENOUS

## 2013-04-13 MED ORDER — MIDAZOLAM HCL 5 MG/5ML IJ SOLN
INTRAMUSCULAR | Status: DC | PRN
  Administered 2013-04-13 (×4): 2 mg via INTRAVENOUS

## 2013-04-13 MED ORDER — ONDANSETRON HCL 4 MG/2ML IJ SOLN
INTRAMUSCULAR | Status: AC
  Filled 2013-04-13: qty 2

## 2013-04-13 MED ORDER — STERILE WATER FOR IRRIGATION IR SOLN
Status: DC | PRN
  Administered 2013-04-13: 13:00:00

## 2013-04-13 MED ORDER — BUTAMBEN-TETRACAINE-BENZOCAINE 2-2-14 % EX AERO
INHALATION_SPRAY | CUTANEOUS | Status: DC | PRN
  Administered 2013-04-13: 2 via TOPICAL

## 2013-04-13 MED ORDER — PROMETHAZINE HCL 25 MG/ML IJ SOLN: 25.0000 mg | Freq: Once | INTRAMUSCULAR | Status: DC

## 2013-04-13 MED ORDER — MIDAZOLAM HCL 5 MG/5ML IJ SOLN
INTRAMUSCULAR | Status: AC
  Filled 2013-04-13: qty 10

## 2013-04-13 MED ORDER — SODIUM CHLORIDE 0.9 % IJ SOLN
INTRAMUSCULAR | Status: AC
  Filled 2013-04-13: qty 10

## 2013-04-13 MED ORDER — PROMETHAZINE HCL 25 MG/ML IJ SOLN
INTRAMUSCULAR | Status: AC
  Administered 2013-04-13: 25 mg
  Filled 2013-04-13: qty 1

## 2013-04-13 MED ORDER — SODIUM CHLORIDE 0.9 % IV SOLN
INTRAVENOUS | Status: DC
  Administered 2013-04-13: 12:00:00 via INTRAVENOUS

## 2013-04-13 MED ORDER — MEPERIDINE HCL 100 MG/ML IJ SOLN
INTRAMUSCULAR | Status: AC
  Filled 2013-04-13: qty 2

## 2013-04-13 NOTE — H&P (Signed)
HPI:  Laura Haynes is a 42 y.o. female here for further evaluation of abnormal weight loss, dysphagia. Diagnosed with bleeding PUD in 03/2012. Hospitalized at [facility] at that time. EGD showed ulcer (Dr. Lutricia Feil per patient) Started on Dexilant at that time. Never had abdominal pain. Presented with hematemesis. States she was given no explanation for the ulcer. States she has had total of three EGDs and had persistent ulcer.  Takes Excedrin regularly.  Currently denies n/v. No heartburn. No abdominal pain. Cannot swallow even water. Struggles to get any food down. States she is hungry but just can't swallow. Discomfort in substernal region with meals.  BM 1-2 per week. Not hard stools or straining. No brbpr, melena. Feels very weak. Weight down 50 pounds since 03/2012. Denies problems taking her pills.       Current Outpatient Prescriptions   Medication  Sig  Dispense  Refill   .  albuterol (PROVENTIL) (2.5 MG/3ML) 0.083% nebulizer solution  Take 2.5 mg by nebulization every 6 (six) hours as needed for wheezing.         Marland Kitchen  ALPRAZolam (XANAX) 1 MG tablet  Take 1 mg by mouth 3 (three) times daily.         Marland Kitchen  dexlansoprazole (DEXILANT) 60 MG capsule  Take 60 mg by mouth daily.         .  fentaNYL (DURAGESIC - DOSED MCG/HR) 50 MCG/HR  Place 1 patch onto the skin every other day.         .  fluorometholone (FML) 0.1 % ophthalmic suspension  Place 1 drop into both eyes 2 (two) times daily as needed.         .  NON FORMULARY  Albuterol Inhaler as needed         .  oxyCODONE-acetaminophen (PERCOCET) 7.5-325 MG per tablet  Take 1 tablet by mouth 2 (two) times daily as needed. pain         .  polyvinyl alcohol-povidone (HYPOTEARS) 1.4-0.6 % ophthalmic solution  Place 1-2 drops into both eyes as needed. Dry eyes         .  predniSONE (DELTASONE) 20 MG tablet  Take 10 mg by mouth daily. Taking 10 mg daily         .  sucralfate (CARAFATE) 1 G tablet  Take 1 g by mouth 4 (four) times daily.              No current facility-administered medications for this visit.         Allergies as of 03/03/2013 - Review Complete 03/03/2013   Allergen  Reaction  Noted   .  Coreg [carvedilol]    11/23/2011   .  Darvocet [propoxyphene-acetaminophen]    11/23/2011   .  Morphine and related    11/23/2011   .  Motrin [ibuprofen]    11/23/2011   .  Phenobarbital  Other (See Comments)  11/23/2011         Past Medical History   Diagnosis  Date   .  Sarcoidosis     .  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  COPD (chronic obstructive pulmonary disease)     .  Asthma     .  Anxiety     .  Oxygen dependent         prn basis  Past Surgical History   Procedure  Laterality  Date   .  Cesarean section    1993   .  Lung biopsy    [id number]   .  Tonsillectomy    2008   .  Back surgery    2008   .  Abdominal hysterectomy    2003   .  Esophagogastroduodenoscopy    March 2014       Dr. Renae Fickle Oh: 1 crater gastric ulcer in the antrum. Esophagus normal. Biopsying benign, no H. pylori.   .  Colonoscopy           age 26, 64. normal per patient   .  Esophagogastroduodenoscopy    January 2014       Dr. Renae Fickle Oh: Gastric ulcer, normal esophagus, biopsies benign, no H. pylori   .  Esophagogastroduodenoscopy    October 2013       Dr. Renae Fickle Oh: Large antral ulcer with visible vessel injected with epinephrine and cauterized. The official report/path unavailable         Family History   Problem  Relation  Age of Onset   .  Colon cancer  Maternal Aunt         greater than age 38   .  Liver disease  Neg Hx     .  Breast cancer  Mother     .  Lung cancer  Father           History       Social History   .  Marital Status:  Married       Spouse Name:  N/A       Number of Children:  1   .  Years of Education:  N/A       Occupational History   .  disability         Social History Main Topics   .  Smoking status:  Current Every Day Smoker   .   Smokeless tobacco:  Not on file         Comment: 1/2 pack a day   .  Alcohol Use:  No   .  Drug Use:  No   .  Sexual Activity:  Not on file       Other Topics  Concern   .  Not on file       Social History Narrative   .  No narrative on file        Physical Examination:       General: Well-nourished, well-developed in no acute distress. Accompanied by spouse. Appears older than stated age. Head: Normocephalic, atraumatic.    Eyes: Conjunctiva pink, no icterus. Mouth: Oropharyngeal mucosa moist and pink , no lesions erythema or exudate. Neck: Supple without thyromegaly, masses, or lymphadenopathy.   Lungs: Clear to auscultation bilaterally.   Heart: Regular rate and rhythm, no murmurs rubs or gallops.   Abdomen: Bowel sounds are normal, nontender, nondistended, no hepatosplenomegaly or masses, no abdominal bruits or    hernia , no rebound or guarding.    Rectal: not performed Extremities: No lower extremity edema. No clubbing or deformities.   Neuro: Alert and oriented x 4 , grossly normal neurologically.   Skin: Warm and dry, no rash or jaundice.    Psych: Alert and cooperative, normal mood and affect.     Impression:     42 y/o female with h/o persistent PUD, weight loss who presents with difficulty swallowing. She reports 3  EGDs since 03/2012 when she presented with hematemesis secondary to PUD. We have requested records. She denies having esophageal dilation. States she cannot eat do to inability to swallow. Food sticking in chest/resulting in pain. No n/v. No abdominal pain. Appetite good.  Barium esophagram here normal. Patient takes Excedrin. She complains of persisting dysphagia and some odynophagia.   Recommendations:  Diagnostic EGD to further evaluate symptoms. Possible esophageal dilation. The risks, benefits, limitations, alternatives and imponderables have been reviewed with the patient. Potential for esophageal dilation, biopsy, etc. have also been  reviewed.  Questions have been answered. All parties agreeable.

## 2013-04-13 NOTE — Op Note (Signed)
[facility] [address] [place], [address]   ENDOSCOPY PROCEDURE REPORT  PATIENT: Laura Haynes, Laura Haynes  MR#: [id number] BIRTHDATE: February 28, 1971 , 42  yrs. old GENDER: Female ENDOSCOPIST: R.  Roetta Sessions, MD FACP [facility] REFERRED BY:  Erskine Speed, FNP PROCEDURE DATE:  04/13/2013 PROCEDURE:     EGD with gastric biopsy  INDICATIONS:    Persisting dyspepsia; history of nonhealing gastric ulcer; esophageal dysphagia -negative barium pill esophagram.  PATIENT ADMITS TO USING EXCEDRIN REGULARLY  INFORMED CONSENT:   The risks, benefits, limitations, alternatives and imponderables have been discussed.  The potential for biopsy, esophogeal dilation, etc. have also been reviewed.  Questions have been answered.  All parties agreeable.  Please see the history and physical in the medical record for more information.  MEDICATIONS:  Versed 8 mg IV and Demerol 175 mg IV in divided doses. Phenergan 25 mg IV and Zofran 4 mg IV. Cetacaine spray.  DESCRIPTION OF PROCEDURE:   The EG-[id number] ([id number])  endoscope was introduced through the mouth and advanced to the second portion of the duodenum without difficulty or limitations.  The mucosal surfaces were surveyed very carefully during advancement of the scope and upon withdrawal.  Retroflexion view of the proximal stomach and esophagogastric junction was performed.      FINDINGS: Widely patent, tubular esophagus. Normal esophageal mucosa. Stomach empty. Small hiatal hernia. Marked deformity scar and partially healed circumferential ulceration of the antrum extending into the pyloric channel. Please see photos. This appears to be a benign process. Otherwise, the gastric mucosa appeared normal. Patent pylorus. Normal first and second portion of the duodenum.  THERAPEUTIC / DIAGNOSTIC MANEUVERS PERFORMED:  biopsies of the abnormal gastric mucosa taken for histologic study. No esophageal dilation performed.   COMPLICATIONS:   None  IMPRESSION:    Extensive ulceration/ scarring/deformity of the antral/prepyloric gastric mucosa as described above-status post gastric biopsy.  Suspect ongoing NSAID (aspirin) used as the primary cause of this problem.  RECOMMENDATIONS:   Continue Dexilant. Continue Carafate. Absolutely refrain from taking all forms of nonsteroidals including Excedrin.  Followup on pathology.    _______________________________ R. Roetta Sessions, MD FACP [facility] eSigned:  R. Roetta Sessions, MD FACP [facility] 04/13/2013 1:28 PM     CC:  PATIENT NAME:  Pierra, Skora MR#: [id number]

## 2013-04-15 ENCOUNTER — Encounter: Payer: Self-pay | Admitting: Internal Medicine

## 2013-04-18 ENCOUNTER — Encounter: Payer: Self-pay | Admitting: Internal Medicine

## 2013-04-18 ENCOUNTER — Telehealth: Payer: Self-pay

## 2013-04-18 ENCOUNTER — Encounter (HOSPITAL_COMMUNITY): Payer: Self-pay | Admitting: Internal Medicine

## 2013-04-18 NOTE — Telephone Encounter (Signed)
CC PCP 

## 2013-04-18 NOTE — Telephone Encounter (Signed)
Letter mailed to pt.  

## 2013-04-18 NOTE — Telephone Encounter (Signed)
Letter from: Corbin Ade  Reason for Letter: Results Review Send letter to patient.  Send copy of letter with path to referring provider and PCP.  Needs office visit with extender in 3 months

## 2013-05-07 IMAGING — CT CT ABDOMEN W/O CM
1 of 2 series · 14 of 32 positions shown, 19 images · non-contrast
Comparison: none

REASON FOR EXAM: hematemesis
COMMENTS:

[Series 2: soft tissue · axial · 0.70mm/px · z∈[-842,-608]mm · 14 of 88 slices shown, 19 images]
[im 5/88  soft-tissue]
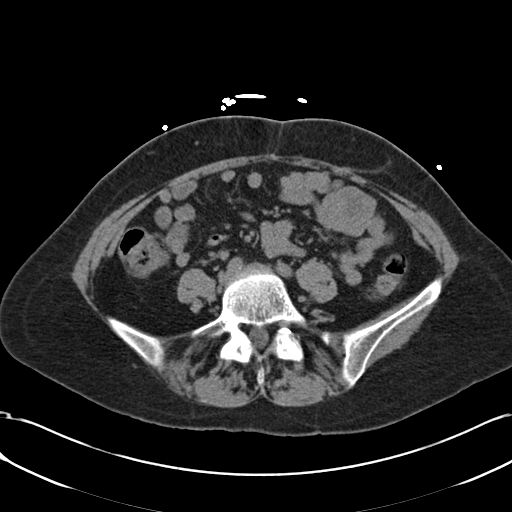
[im 5/88  bone]
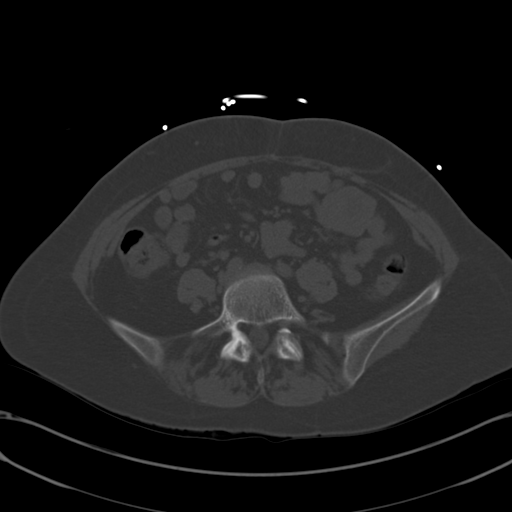
[im 14/88  soft-tissue]
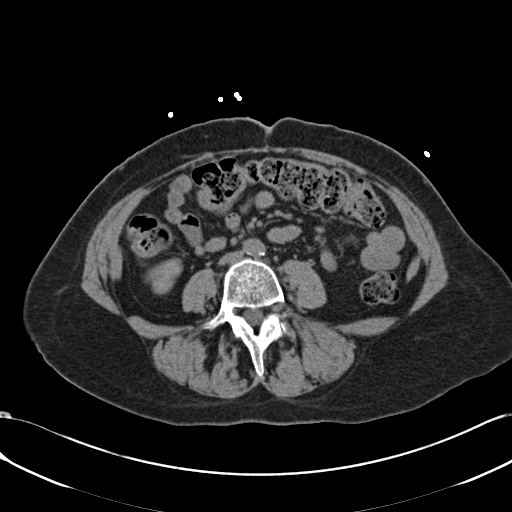
[im 18/88  soft-tissue]
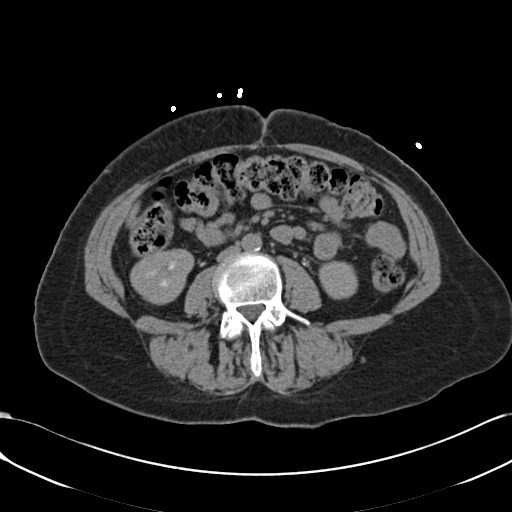
[im 27/88  soft-tissue]
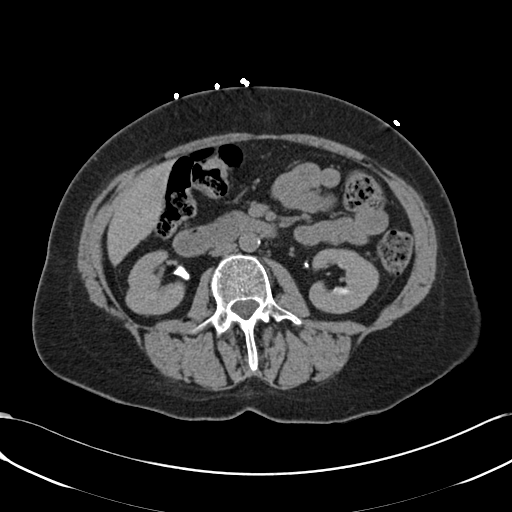
[im 31/88  soft-tissue]
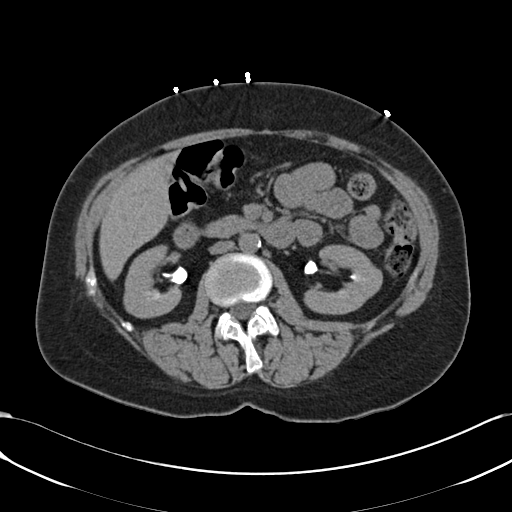
[im 40/88  soft-tissue]
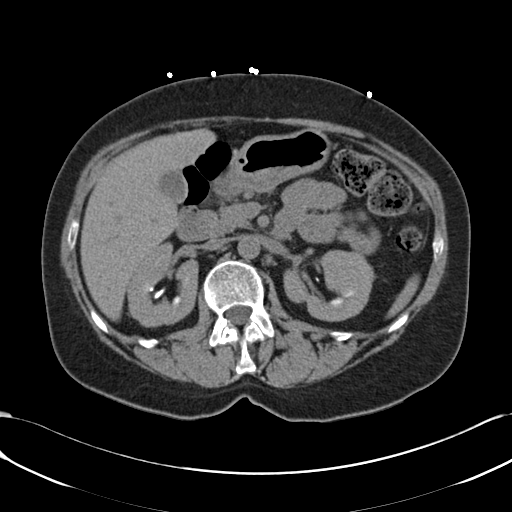
[im 44/88  soft-tissue]
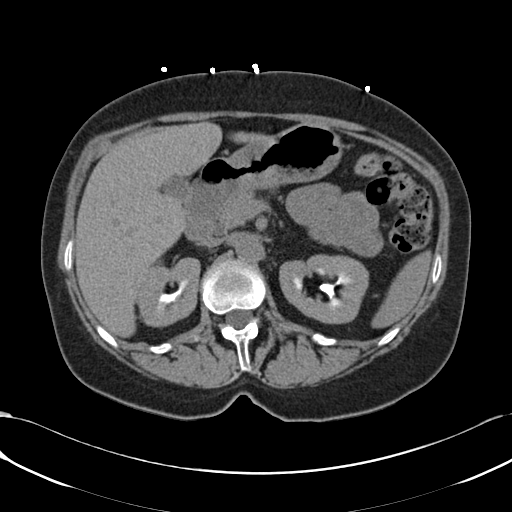
[im 48/88  soft-tissue]
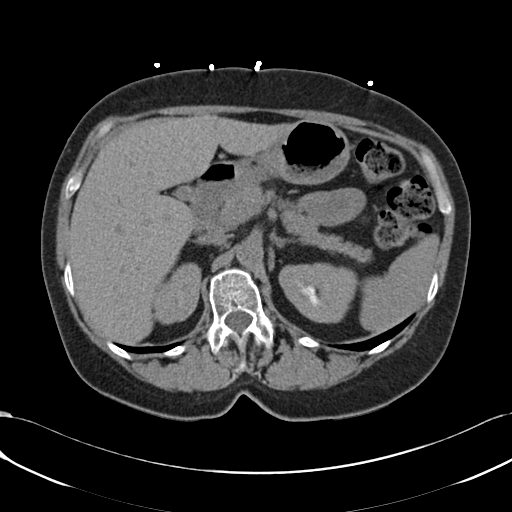
[im 57/88  soft-tissue]
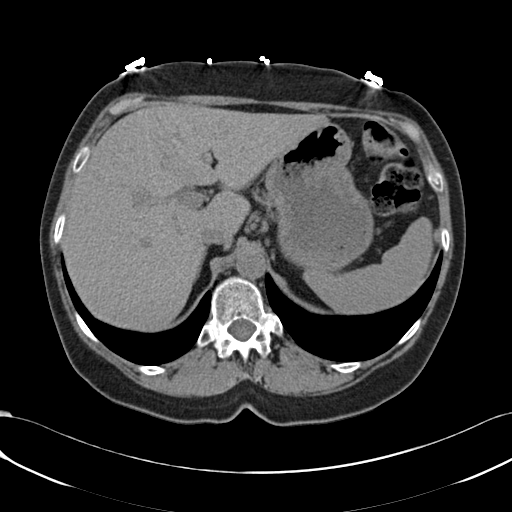
[im 57/88  bone]
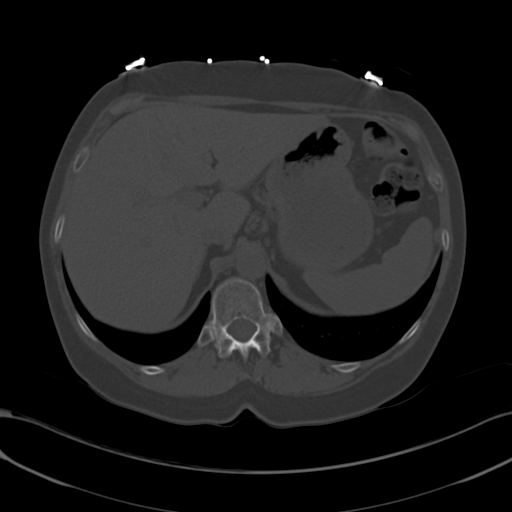
[im 61/88  soft-tissue]
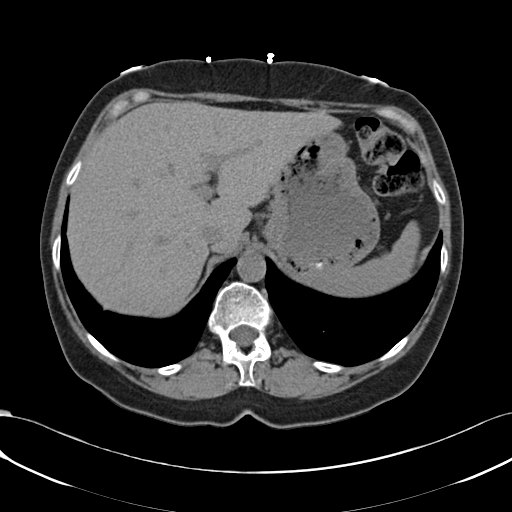
[im 70/88  soft-tissue]
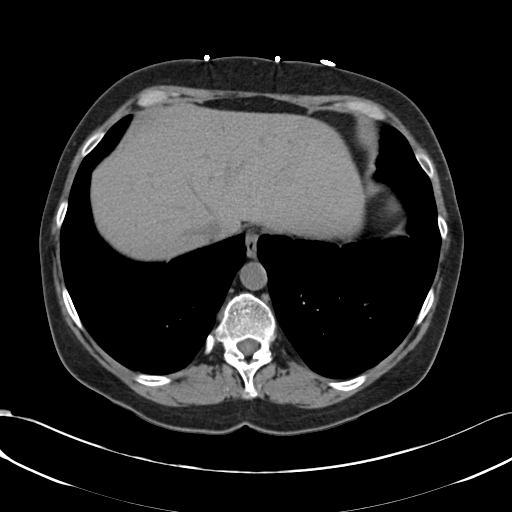
[im 70/88  lung]
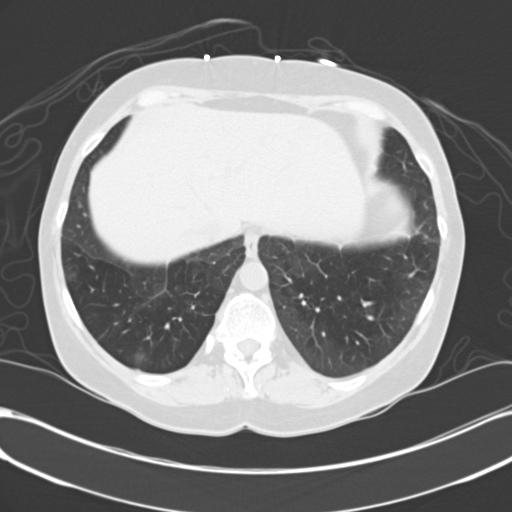
[im 74/88  soft-tissue]
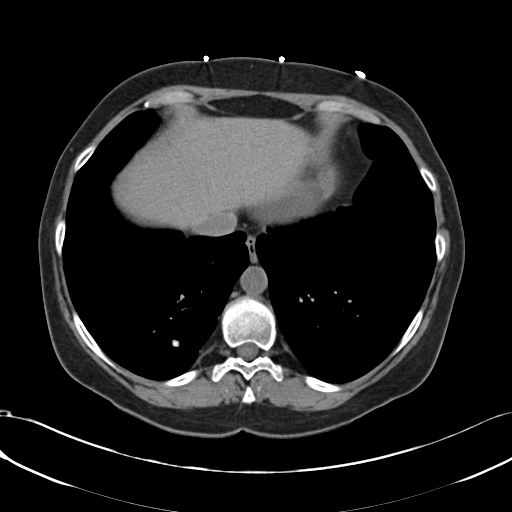
[im 74/88  lung]
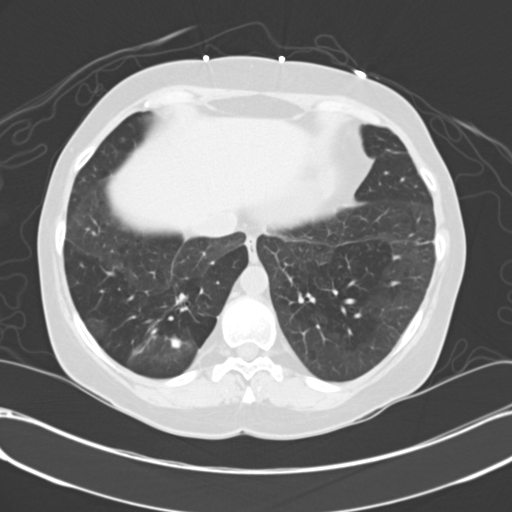
[im 79/88  lung]
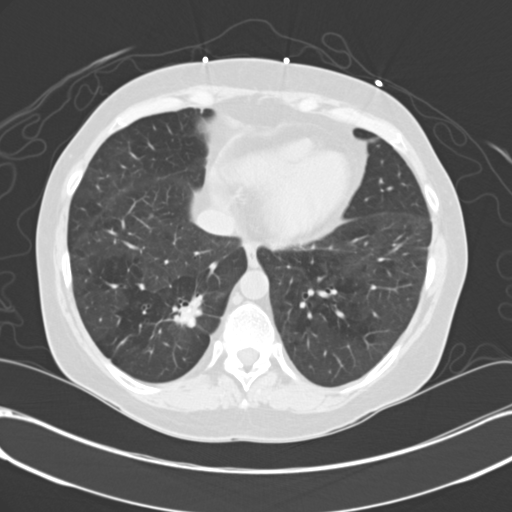
[im 83/88  soft-tissue]
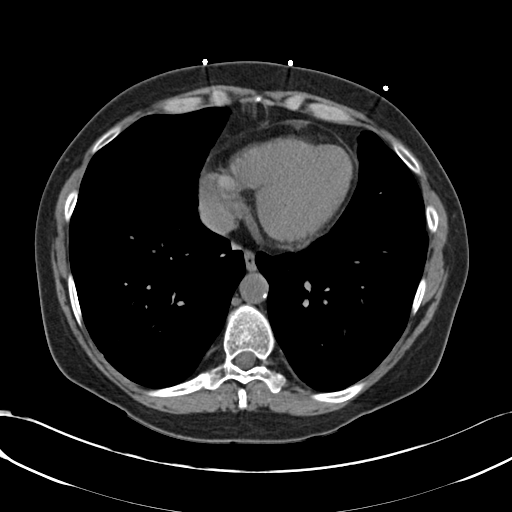
[im 83/88  lung]
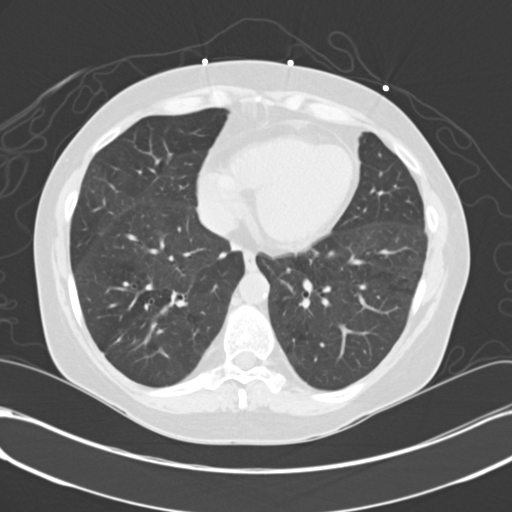

[14 of 32 positions shown; findings below may reference images not displayed]

PROCEDURE:     CT  - CT ABDOMEN STANDARD WO  - April 07, 2012  [DATE]

RESULT:     CT without contrast is performed through the abdomen and
reconstructed at 3 mm slice thickness in the axial plane. No oral contrast
was given. The patient had a bolus of IV contrast earlier 4 CT of the chest
with 75 mL of Dsovue-N69 at [DATE] p.m..

There is excretion of contrast opacified urine by both kidneys without
obstruction. There is mild thickening of the wall of the stomach which can
be consistent with gastritis. Endoscopic correlation could be considered.
Noncontrast images of the liver and spleen appear unremarkable. The adrenal
glands appear within normal limits. Nodular densities in the right lower
lobe are again noted. There is a moderate amount of fecal material in the
colon. The pancreas and adrenal glands appear unremarkable. The aorta
appears normal. Minimal atherosclerotic calcification is present area there
is no retroperitoneal or mesenteric adenopathy or mass appreciated. No
abnormal bowel distention is evident.
IMPRESSION: 1. Cannot exclude some mild thickening of the gastric wall. Correlate for
gastritis. Endoscopic evaluation may be beneficial.
2. Nodular densities in the right lower lobe very short term followup CT
without and with contrast is recommended.
3. Nondistention of loops of bowel with a grossly normal appearance of the
included colon and small bowel.

[REDACTED]

## 2013-05-07 IMAGING — CT CT CHEST W/ CM
1 series · 15 of 33 positions shown, 19 images · IV contrast (agent unspecified)
Comparison: none

REASON FOR EXAM: area on plain x-ray worrisome for cavitation or necrotic
area.  WBC 19k.
COMMENTS:

PROCEDURE:     CT  - CT CHEST WITH CONTRAST  - April 07, 2012  [DATE]
RESULT:
TECHNIQUE: Chest CT with 75 ml of 0sovue-WXR iodinated intravenous contrast
is reconstructed at 3.0 mm slice thickness in the axial plane.
There is no previous CT for comparison.

[Series 2: soft tissue · axial · 0.68mm/px · z∈[-790,-508]mm · 15 of 112 slices shown, 19 images]
[im 9/112  mediastinal]
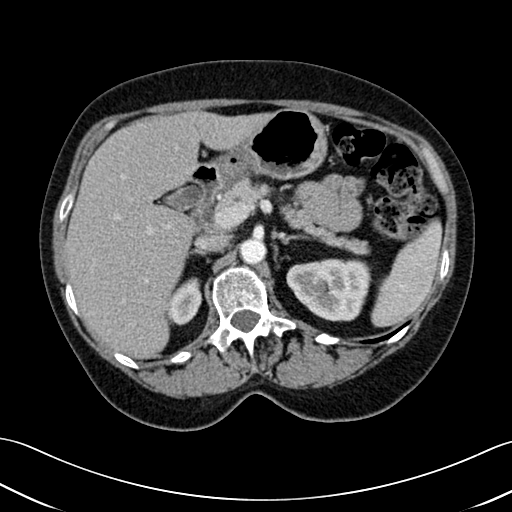
[im 9/112  lung]
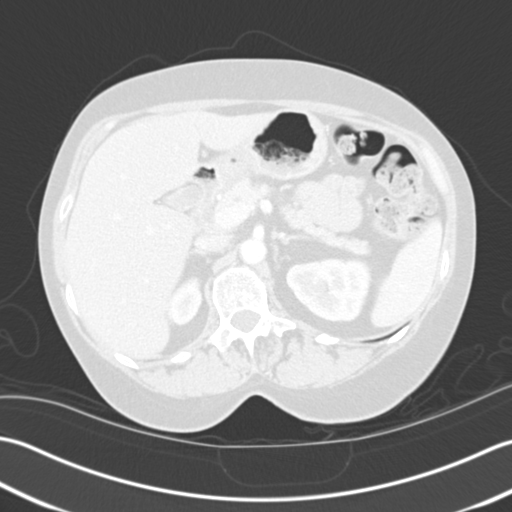
[im 17/112  lung]
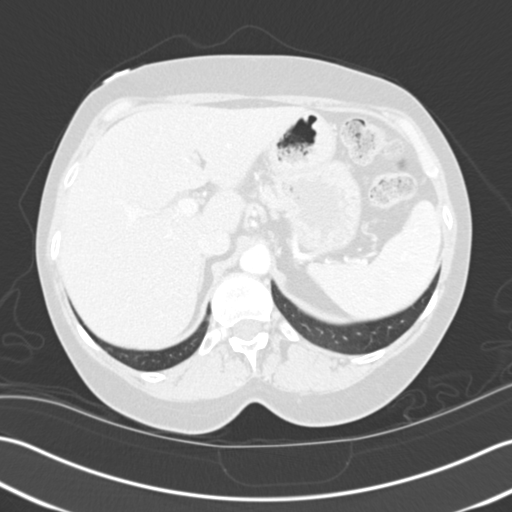
[im 23/112  lung]
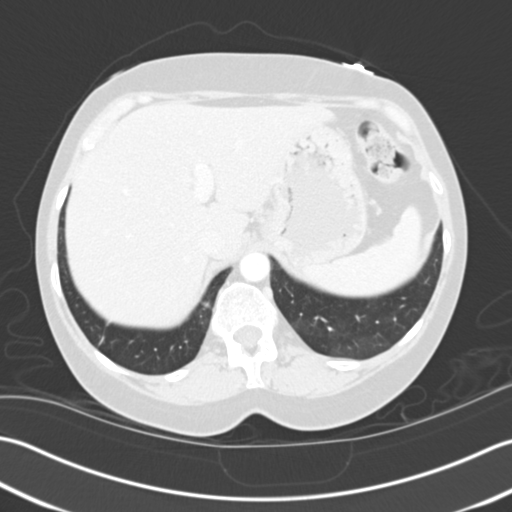
[im 29/112  lung]
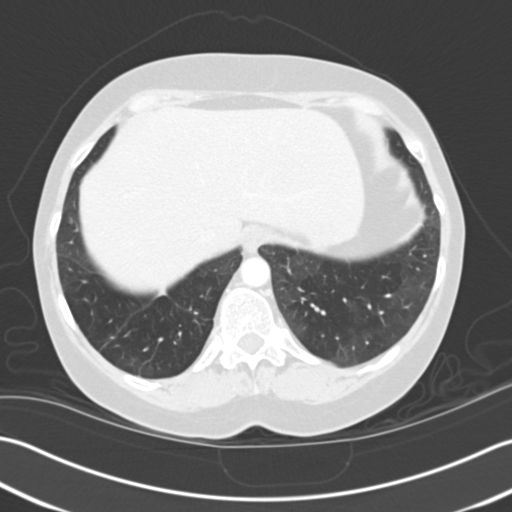
[im 38/112  mediastinal]
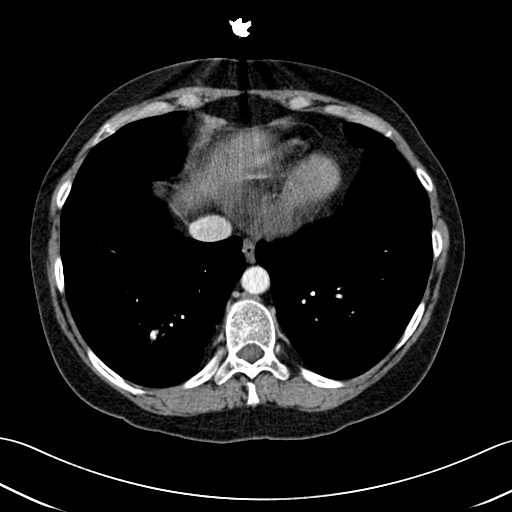
[im 38/112  lung]
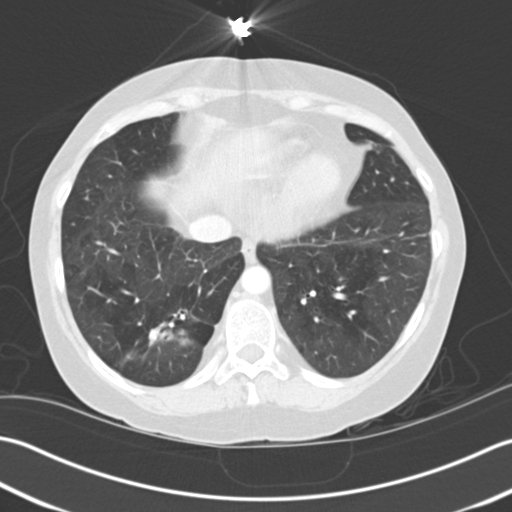
[im 45/112  lung]
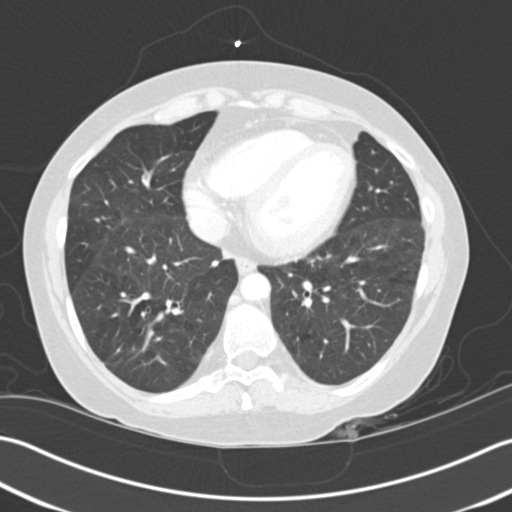
[im 50/112  lung]
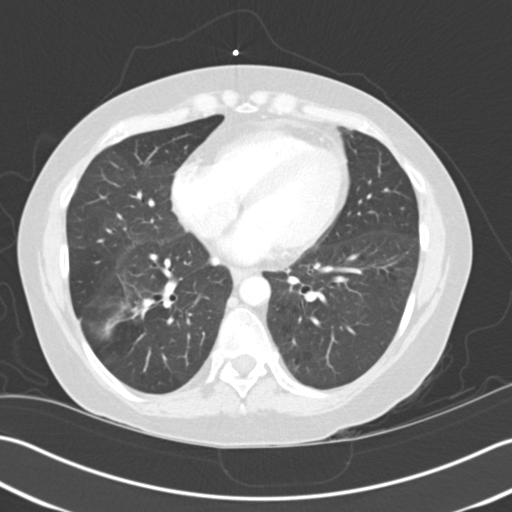
[im 58/112  lung]
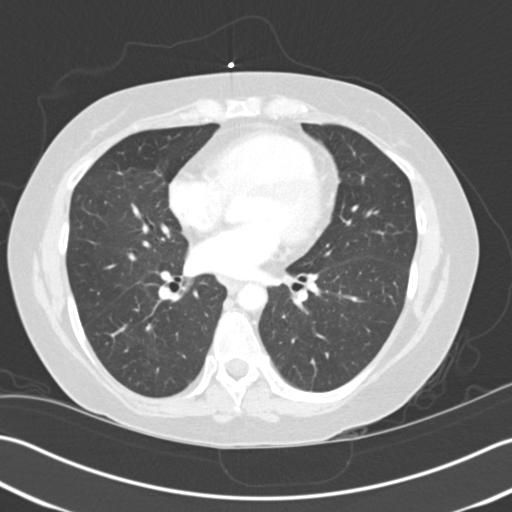
[im 62/112  mediastinal]
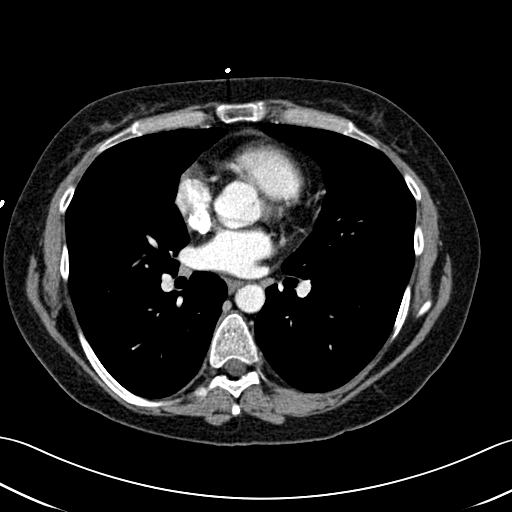
[im 62/112  lung]
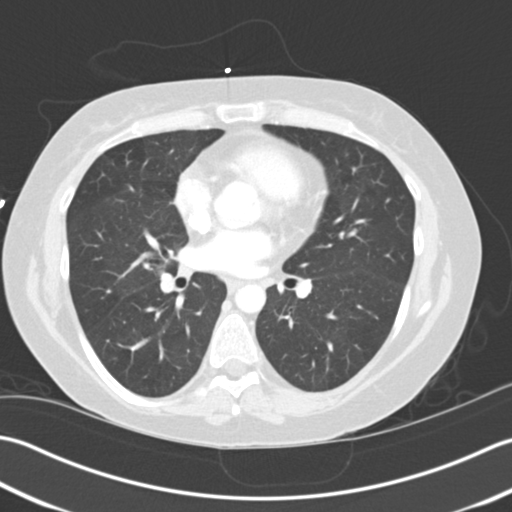
[im 67/112  lung]
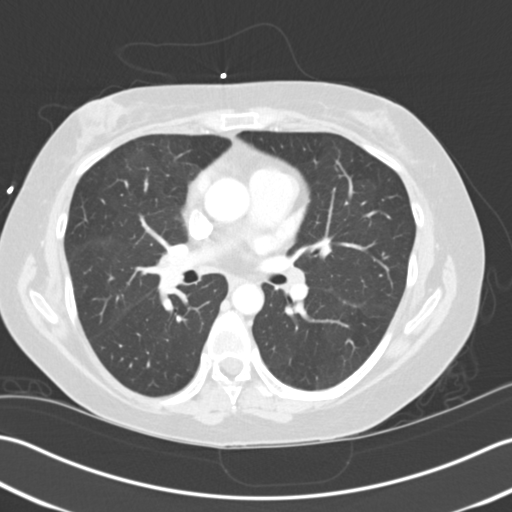
[im 75/112  lung]
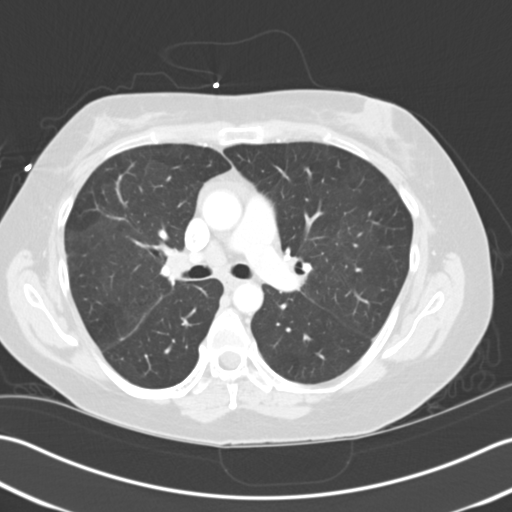
[im 83/112  lung]
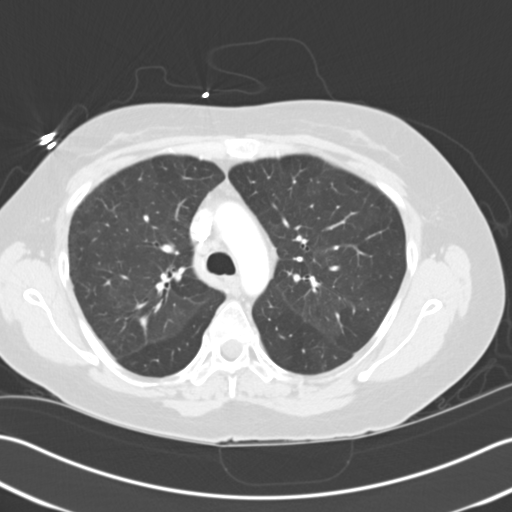
[im 89/112  mediastinal]
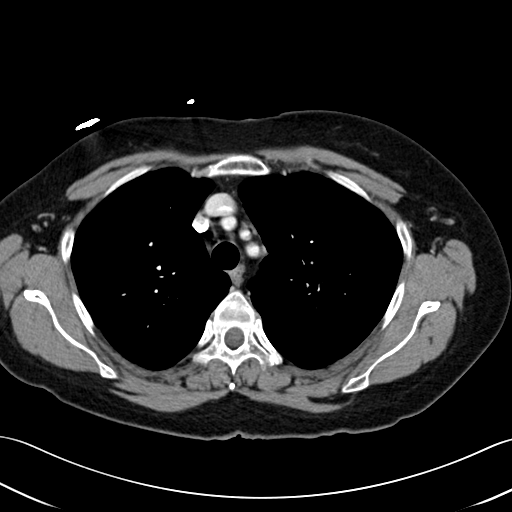
[im 89/112  lung]
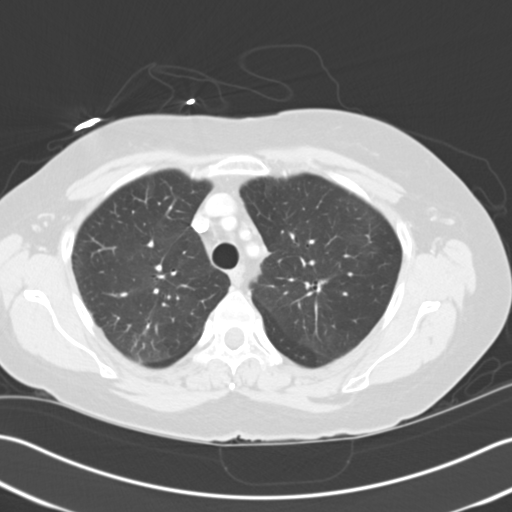
[im 95/112  lung]
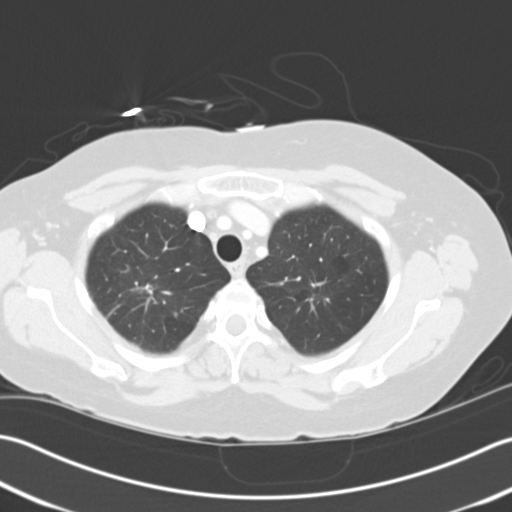
[im 103/112  lung]
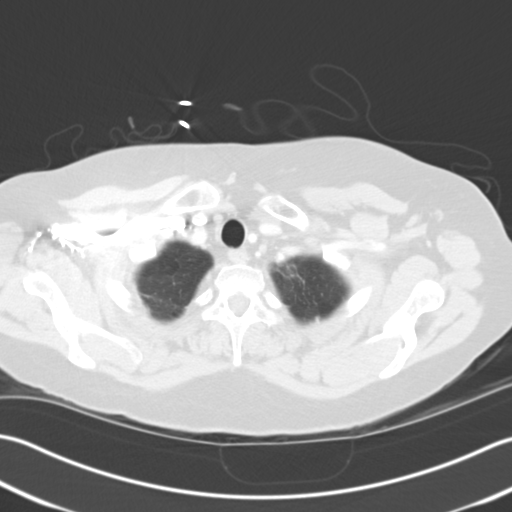

[15 of 33 positions shown; findings below may reference images not displayed]

FINDINGS: Within the right lower lobe, there are multiple areas of nodular
density with the most prominent area being seen in the right lower lobe
between images 71 and 75. There is increased density in this area which
could be secondary to calcification or enhancement. Scattered, ground-glass
areas of attenuation are seen in both lungs which could represent mild
pneumonitis or edema. There is no pleural or pericardial effusion. The left
lung appears normally aerated. Right lung upper lobe apical linear density
suggests fibrosis. No mediastinal or hilar mass or adenopathy is evident.
The included upper abdominal structures appear normal. A small pulmonary
vein is seen entering the inferior posterior right atrial region in the area
of image 60 to 62 draining the right lower lobe region which may represent a
variant pulmonary vein location.
IMPRESSION: 1.  Abnormal areas of density in the right lower lobe. Differential
considerations include an acute infectious process, underlying malignancy,
benign enhancing vascular lesions and possibly a small area of pulmonary
sequestration. There is no effusion. Short-term follow-up CT in three months
is recommended. This can be performed sooner if the patient's condition
worsens.
2.  Mild ground-glass opacity scattered in the lungs which could represent
underlying pneumonitis. Mild edema is felt to be less likely. There is no
cardiomegaly.

[REDACTED]

## 2013-05-12 ENCOUNTER — Other Ambulatory Visit: Payer: Self-pay

## 2013-05-12 MED ORDER — SUCRALFATE 1 G PO TABS: 1.0000 g | ORAL_TABLET | Freq: Four times a day (QID) | ORAL | Status: DC

## 2013-05-18 NOTE — Patient Instructions (Signed)
Laura Haynes  05/18/2013   Your procedure is scheduled on:  05/26/13  Report to [facility] at 1300 PM.  Call this number if you have problems the morning of surgery: [phone]   Remember:   Do not eat food or drink liquids after midnight.   Take these medicines the morning of surgery with A SIP OF WATER: xanax, albuterol, dexilant, percocet   Do not wear jewelry, make-up or nail polish.  Do not wear lotions, powders, or perfumes. You may wear deodorant.  Do not shave 48 hours prior to surgery. Men may shave face and neck.  Do not bring valuables to the hospital.  [facility] is not responsible                  for any belongings or valuables.               Contacts, dentures or bridgework may not be worn into surgery.  Leave suitcase in the car. After surgery it may be brought to your room.  For patients admitted to the hospital, discharge time is determined by your                treatment team.               Patients discharged the day of surgery will not be allowed to drive  home.  Name and phone number of your driver: family  Special Instructions: N/A   Please read over the following fact sheets that you were given: Pain Booklet, Surgical Site Infection Prevention, Anesthesia Post-op Instructions and Care and Recovery After Surgery   PATIENT INSTRUCTIONS POST-ANESTHESIA  IMMEDIATELY FOLLOWING SURGERY:  Do not drive or operate machinery for the first twenty four hours after surgery.  Do not make any important decisions for twenty four hours after surgery or while taking narcotic pain medications or sedatives.  If you develop intractable nausea and vomiting or a severe headache please notify your doctor immediately.  FOLLOW-UP:  Please make an appointment with your surgeon as instructed. You do not need to follow up with anesthesia unless specifically instructed to do so.  WOUND CARE INSTRUCTIONS (if applicable):  Keep a dry clean dressing on the anesthesia/puncture wound  site if there is drainage.  Once the wound has quit draining you may leave it open to air.  Generally you should leave the bandage intact for twenty four hours unless there is drainage.  If the epidural site drains for more than 36-48 hours please call the anesthesia department.  QUESTIONS?:  Please feel free to call your physician or the hospital operator if you have any questions, and they will be happy to assist you.

## 2013-05-23 ENCOUNTER — Inpatient Hospital Stay (HOSPITAL_COMMUNITY)
Admission: RE | Admit: 2013-05-23 | Discharge: 2013-05-23 | Disposition: A | Discharge status: Home / Self Care | Payer: Medicaid Other | Source: Ambulatory Visit

## 2013-05-26 ENCOUNTER — Encounter (HOSPITAL_COMMUNITY): Admission: RE | Payer: Self-pay | Source: Ambulatory Visit

## 2013-05-26 ENCOUNTER — Ambulatory Visit (HOSPITAL_COMMUNITY): Admission: RE | Admit: 2013-05-26 | Payer: Medicaid Other | Source: Ambulatory Visit | Admitting: Ophthalmology

## 2013-05-26 SURGERY — PHACOEMULSIFICATION, CATARACT, WITH IOL INSERTION
Anesthesia: Monitor Anesthesia Care | Site: Eye | Laterality: Left

## 2013-06-16 ENCOUNTER — Inpatient Hospital Stay: Payer: Self-pay | Admitting: Family Medicine

## 2013-06-16 LAB — COMPREHENSIVE METABOLIC PANEL
Albumin: 3.4 g/dL (ref 3.4–5.0)
Anion Gap: 4 — ABNORMAL LOW (ref 7–16)
Bilirubin,Total: 0.2 mg/dL (ref 0.2–1.0)
Calcium, Total: 9 mg/dL (ref 8.5–10.1)
Chloride: 105 mmol/L (ref 98–107)
Creatinine: 0.53 mg/dL — ABNORMAL LOW (ref 0.60–1.30)
EGFR (Non-African Amer.): >60
Glucose: 107 mg/dL — ABNORMAL HIGH (ref 65–99)
Osmolality: 271 (ref 275–301)
Potassium: 4.1 mmol/L (ref 3.5–5.1)
SGOT(AST): 19 U/L (ref 15–37)
SGPT (ALT): 10 U/L — ABNORMAL LOW (ref 12–78)
Sodium: 136 mmol/L (ref 136–145)

## 2013-06-16 LAB — CBC
HGB: 13.9 g/dL (ref 12.0–16.0)
MCH: 31.3 pg (ref 26.0–34.0)
MCHC: 33.7 g/dL (ref 32.0–36.0)
RDW: 13.7 % (ref 11.5–14.5)
WBC: 16.3 10*3/uL — ABNORMAL HIGH (ref 3.6–11.0)

## 2013-06-16 LAB — TROPONIN I: Troponin-I: <0.02 ng/mL

## 2013-06-20 ENCOUNTER — Encounter (HOSPITAL_COMMUNITY): Payer: Self-pay | Admitting: Emergency Medicine

## 2013-06-20 ENCOUNTER — Emergency Department (HOSPITAL_COMMUNITY): Payer: Medicaid Other

## 2013-06-20 ENCOUNTER — Emergency Department (HOSPITAL_COMMUNITY)
Admission: EM | Admit: 2013-06-20 | Discharge: 2013-06-20 | Disposition: A | Discharge status: Home / Self Care | Payer: Medicaid Other | Attending: Emergency Medicine | Admitting: Emergency Medicine

## 2013-06-20 DIAGNOSIS — F17200 Nicotine dependence, unspecified, uncomplicated: Secondary | ICD-10-CM | POA: Insufficient documentation

## 2013-06-20 DIAGNOSIS — Z79899 Other long term (current) drug therapy: Secondary | ICD-10-CM | POA: Insufficient documentation

## 2013-06-20 DIAGNOSIS — J40 Bronchitis, not specified as acute or chronic: Secondary | ICD-10-CM

## 2013-06-20 DIAGNOSIS — J45901 Unspecified asthma with (acute) exacerbation: Secondary | ICD-10-CM | POA: Insufficient documentation

## 2013-06-20 DIAGNOSIS — F411 Generalized anxiety disorder: Secondary | ICD-10-CM | POA: Insufficient documentation

## 2013-06-20 DIAGNOSIS — R5381 Other malaise: Secondary | ICD-10-CM | POA: Insufficient documentation

## 2013-06-20 DIAGNOSIS — Z792 Long term (current) use of antibiotics: Secondary | ICD-10-CM | POA: Insufficient documentation

## 2013-06-20 DIAGNOSIS — D869 Sarcoidosis, unspecified: Secondary | ICD-10-CM

## 2013-06-20 DIAGNOSIS — Z9981 Dependence on supplemental oxygen: Secondary | ICD-10-CM | POA: Insufficient documentation

## 2013-06-20 DIAGNOSIS — IMO0002 Reserved for concepts with insufficient information to code with codable children: Secondary | ICD-10-CM | POA: Insufficient documentation

## 2013-06-20 DIAGNOSIS — R6883 Chills (without fever): Secondary | ICD-10-CM | POA: Insufficient documentation

## 2013-06-20 LAB — COMPREHENSIVE METABOLIC PANEL
ALT: 7 U/L (ref 0–35)
Albumin: 2.5 g/dL — ABNORMAL LOW (ref 3.5–5.2)
BUN: 17 mg/dL (ref 6–23)
CO2: 22 mEq/L (ref 19–32)
Calcium: 7.2 mg/dL — ABNORMAL LOW (ref 8.4–10.5)
Creatinine, Ser: 0.46 mg/dL — ABNORMAL LOW (ref 0.50–1.10)
GFR calc Af Amer: >90 mL/min (ref >90)
GFR calc non Af Amer: >90 mL/min (ref >90)
Potassium: 3.1 mEq/L — ABNORMAL LOW (ref 3.5–5.1)
Total Protein: 5.9 g/dL — ABNORMAL LOW (ref 6.0–8.3)

## 2013-06-20 LAB — URINALYSIS, ROUTINE W REFLEX MICROSCOPIC
Ketones, ur: NEGATIVE mg/dL
Leukocytes, UA: NEGATIVE
Nitrite: NEGATIVE
Specific Gravity, Urine: 1.02 (ref 1.005–1.030)
Urobilinogen, UA: 0.2 mg/dL (ref 0.0–1.0)
pH: 5 (ref 5.0–8.0)

## 2013-06-20 LAB — CBC WITH DIFFERENTIAL/PLATELET
Basophils Relative: 0 % (ref 0–1)
Eosinophils Absolute: 0.1 10*3/uL (ref 0.0–0.7)
Eosinophils Relative: 1 % (ref 0–5)
Hemoglobin: 10.6 g/dL — ABNORMAL LOW (ref 12.0–15.0)
Lymphs Abs: 2.2 10*3/uL (ref 0.7–4.0)
MCH: 31.1 pg (ref 26.0–34.0)
MCHC: 32.5 g/dL (ref 30.0–36.0)
MCV: 95.6 fL (ref 78.0–100.0)
Monocytes Absolute: 0.7 10*3/uL (ref 0.1–1.0)
Monocytes Relative: 5 % (ref 3–12)
Neutro Abs: 11.9 10*3/uL — ABNORMAL HIGH (ref 1.7–7.7)
Neutrophils Relative %: 80 % — ABNORMAL HIGH (ref 43–77)

## 2013-06-20 LAB — URINE MICROSCOPIC-ADD ON

## 2013-06-20 LAB — LACTIC ACID, PLASMA: Lactic Acid, Venous: 1 mmol/L (ref 0.5–2.2)

## 2013-06-20 LAB — D-DIMER, QUANTITATIVE: D-Dimer, Quant: 0.41 ug/mL-FEU (ref 0.00–0.48)

## 2013-06-20 MED ORDER — DEXTROSE 5 % IV SOLN
500.0000 mg | Freq: Once | INTRAVENOUS | Status: AC
  Administered 2013-06-20: 500 mg via INTRAVENOUS

## 2013-06-20 MED ORDER — AZITHROMYCIN 250 MG PO TABS: ORAL_TABLET | ORAL | Status: DC

## 2013-06-20 MED ORDER — SODIUM CHLORIDE 0.9 % IV SOLN
1000.0000 mL | Freq: Once | INTRAVENOUS | Status: AC
  Administered 2013-06-20: 1000 mL via INTRAVENOUS

## 2013-06-20 MED ORDER — DEXTROSE 5 % IV SOLN
1.0000 g | Freq: Once | INTRAVENOUS | Status: AC
  Administered 2013-06-20: 1 g via INTRAVENOUS
  Filled 2013-06-20: qty 10

## 2013-06-20 MED ORDER — ONDANSETRON HCL 8 MG PO TABS: 8.0000 mg | ORAL_TABLET | Freq: Three times a day (TID) | ORAL | Status: DC | PRN

## 2013-06-20 MED ORDER — PREDNISONE 10 MG PO TABS: 10.0000 mg | ORAL_TABLET | Freq: Once | ORAL | Status: DC

## 2013-06-20 MED ORDER — ONDANSETRON HCL 4 MG/2ML IJ SOLN
4.0000 mg | Freq: Once | INTRAMUSCULAR | Status: AC
  Administered 2013-06-20: 4 mg via INTRAVENOUS
  Filled 2013-06-20: qty 2

## 2013-06-20 MED ORDER — PREDNISONE 10 MG PO TABS: ORAL_TABLET | ORAL | Status: DC

## 2013-06-20 MED ORDER — SODIUM CHLORIDE 0.9 % IV SOLN
1000.0000 mL | INTRAVENOUS | Status: DC
  Administered 2013-06-20: 1000 mL via INTRAVENOUS

## 2013-06-20 MED ORDER — POTASSIUM CHLORIDE CRYS ER 20 MEQ PO TBCR
40.0000 meq | EXTENDED_RELEASE_TABLET | Freq: Once | ORAL | Status: AC
  Administered 2013-06-20: 40 meq via ORAL
  Filled 2013-06-20: qty 2

## 2013-06-20 NOTE — ED Provider Notes (Signed)
CSN: [id number]     Arrival date & time 06/20/13  1124 History  This chart was scribed for Flint Melter, MD by Smiley Houseman, ED Scribe. The patient was seen in room APA05/APA05. Patient's care was started at 2:58 PM.    Chief Complaint  Patient presents with  . Shortness of Breath   The history is provided by the patient. No language interpreter was used.   HPI Comments: NADIRA SINGLE is a 42 y.o. female who presents to the Emergency Department complaining of a cough productive of green mucous, with associated SOB an chest pain that onset 5 days ago.  Pt denies vomiting, diarrhea, fever, but is heaving cold sweats, chills and fatigue.  She reports she has been using a nebulizer with some relief.  She reports she visited a doctor at the [facility], where a chest x-ray was completed, but she didn't receive any results.  She denies any sick contacts.    Past Medical History  Diagnosis Date  . Sarcoidosis     chronic pain medication  . 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 COPD (chronic obstructive pulmonary disease)   . Asthma   . Anxiety   . Oxygen dependent     prn basis   Past Surgical History  Procedure Laterality Date  . Cesarean section  1993  . Lung biopsy  [id number]  . Tonsillectomy  2008  . Back surgery  2008  . Abdominal hysterectomy  2003  . Esophagogastroduodenoscopy  March 2014    Dr. Renae Fickle Oh: 1 crater gastric ulcer in the antrum. Esophagus normal. Biopsying benign, no H. pylori.  . Colonoscopy      age 72, 42. normal per patient  . Esophagogastroduodenoscopy  January 2014    Dr. Renae Fickle Oh: Gastric ulcer, normal esophagus, biopsies benign, no H. pylori  . Esophagogastroduodenoscopy  October 2013    Dr. Renae Fickle Oh: Large gastric ulcer with visible vessel injected with epinephrine. No biopsy done.  . Esophagogastroduodenoscopy (egd) with esophageal dilation N/A 04/13/2013    Procedure: ESOPHAGOGASTRODUODENOSCOPY (EGD) WITH  ESOPHAGEAL DILATION;  Surgeon: Corbin Ade, MD;  Location: [facility];  Service: Endoscopy;  Laterality: N/A;   Family History  Problem Relation Age of Onset  . Colon cancer Maternal Aunt     greater than age 38  . Liver disease Neg Hx   . Breast cancer Mother   . Lung cancer Father    History  Substance Use Topics  . Smoking status: Current Every Day Smoker  . Smokeless tobacco: Not on file     Comment: 1/2 pack a day  . Alcohol Use: No   OB History   Grav Para Term Preterm Abortions TAB SAB Ect Mult Living                 Review of Systems  Constitutional: Positive for chills and fatigue. Negative for fever.       Per HPI, otherwise negative  HENT: Negative for congestion and rhinorrhea.        Per HPI, otherwise negative  Respiratory: Positive for cough and shortness of breath.        Per HPI, otherwise negative  Cardiovascular: Positive for chest pain.       Per HPI, otherwise negative  Gastrointestinal: Negative for nausea, vomiting, abdominal pain and diarrhea.  Endocrine:       Negative aside from HPI  Genitourinary:  Neg aside from HPI   Musculoskeletal: Negative for back pain.       Per HPI, otherwise negative  Skin: Negative.  Negative for color change and rash.  All other systems reviewed and are negative.    Allergies  Coreg; Darvocet; Morphine and related; Motrin; and Phenobarbital  Home Medications   Current Outpatient Rx  Name  Route  Sig  Dispense  Refill  . albuterol (PROVENTIL HFA;VENTOLIN HFA) 108 (90 BASE) MCG/ACT inhaler   Inhalation   Inhale 2 puffs into the lungs every 6 (six) hours as needed for wheezing or shortness of breath.         Marland Kitchen albuterol (PROVENTIL) (2.5 MG/3ML) 0.083% nebulizer solution   Nebulization   Take 2.5 mg by nebulization every 6 (six) hours as needed for wheezing.         Marland Kitchen ALPRAZolam (XANAX) 1 MG tablet   Oral   Take 1 mg by mouth 3 (three) times daily as needed for anxiety.          .  clobetasol cream (TEMOVATE) 0.05 %   Topical   Apply 1 application topically 2 (two) times daily as needed (for irritation).         Marland Kitchen dexlansoprazole (DEXILANT) 60 MG capsule   Oral   Take 60 mg by mouth daily.         . fentaNYL (DURAGESIC - DOSED MCG/HR) 75 MCG/HR   Transdermal   Place 75 mcg onto the skin every 3 (three) days.         Marland Kitchen guaiFENesin (MUCINEX) 600 MG 12 hr tablet   Oral   Take 1,200 mg by mouth 2 (two) times daily as needed for cough or to loosen phlegm.         Marland Kitchen ketotifen (ALAWAY) 0.025 % ophthalmic solution   Both Eyes   Place 1 drop into both eyes 2 (two) times daily.         Marland Kitchen oxyCODONE-acetaminophen (PERCOCET) 7.5-325 MG per tablet   Oral   Take 1 tablet by mouth 2 (two) times daily as needed for pain (for breakthrough pain). pain         . polyvinyl alcohol-povidone (HYPOTEARS) 1.4-0.6 % ophthalmic solution   Both Eyes   Place 1-2 drops into both eyes as needed. Dry eyes         . predniSONE (DELTASONE) 10 MG tablet   Oral   Take 10 mg by mouth daily.         . sucralfate (CARAFATE) 1 G tablet   Oral   Take 1 tablet (1 g total) by mouth 4 (four) times daily.   120 tablet   0     May dispense liquid or pill.   Marland Kitchen azithromycin (ZITHROMAX Z-PAK) 250 MG tablet      2 po day one, then 1 daily x 4 days   5 tablet   0   . predniSONE (DELTASONE) 10 MG tablet      Take q Day 6,5,4,3,2,1   21 tablet   0    Triage Vitals: BP 107/70  Pulse 101  Temp(Src) 98.7 F (37.1 C) (Oral)  Resp 20  Ht 5\' 8"  (1.727 m)  Wt 132 lb (59.875 kg)  BMI 20.08 kg/m2  SpO2 90% Physical Exam  Nursing note and vitals reviewed. Constitutional: She is oriented to person, place, and time. She appears well-developed and well-nourished.  HENT:  Head: Normocephalic and atraumatic.  Eyes: Conjunctivae and EOM are normal. Pupils  are equal, round, and reactive to light.  Neck: Normal range of motion and phonation normal. Neck supple.  Cardiovascular:  Normal rate, regular rhythm and intact distal pulses.   Pulmonary/Chest: Effort normal and breath sounds normal. She exhibits no tenderness.  Bilateral Wheezing Decreased air movement  Abdominal: Soft. She exhibits no distension. There is no tenderness. There is no guarding.  Musculoskeletal: Normal range of motion.  Neurological: She is alert and oriented to person, place, and time. She exhibits normal muscle tone.  Skin: Skin is warm and dry.  Psychiatric: She has a normal mood and affect. Her behavior is normal. Judgment and thought content normal.    ED Course  Procedures (including critical care time)  Patient Vitals for the past 24 hrs:  BP Temp Temp src Pulse Resp SpO2 Height Weight  06/20/13 1810 - - - - - 93 % - -  06/20/13 1809 120/78 mmHg - - 88 20 87 % - -  06/20/13 1705 91/53 mmHg - - 79 18 94 % - -  06/20/13 1600 115/77 mmHg 98.2 F (36.8 C) - 75 34 98 % - -  06/20/13 1500 119/72 mmHg - - 94 16 94 % - -  06/20/13 1238 107/70 mmHg 98.7 F (37.1 C) Oral 101 20 90 % 5\' 8"  (1.727 m) 132 lb (59.875 kg)    DIAGNOSTIC STUDIES: Oxygen Saturation is 90% on RA, adequate by my interpretation.    COORDINATION OF CARE: 3:03 PM-Will order IV fluids, Rocephin, and Zithromax.  Will order chest X-ray, CBC, and UA. Patient informed of current plan of treatment and evaluation and agrees with plan.    Labs Review Labs Reviewed  CBC WITH DIFFERENTIAL - Abnormal; Notable for the following:    WBC 14.9 (*)    RBC 3.41 (*)    Hemoglobin 10.6 (*)    HCT 32.6 (*)    Neutrophils Relative % 80 (*)    Neutro Abs 11.9 (*)    All other components within normal limits  COMPREHENSIVE METABOLIC PANEL - Abnormal; Notable for the following:    Potassium 3.1 (*)    Glucose, Bld 113 (*)    Creatinine, Ser 0.46 (*)    Calcium 7.2 (*)    Total Protein 5.9 (*)    Albumin 2.5 (*)    Total Bilirubin 0.2 (*)    All other components within normal limits  URINALYSIS, ROUTINE W REFLEX MICROSCOPIC -  Abnormal; Notable for the following:    APPearance HAZY (*)    Hgb urine dipstick MODERATE (*)    All other components within normal limits  URINE MICROSCOPIC-ADD ON - Abnormal; Notable for the following:    Squamous Epithelial / LPF FEW (*)    Bacteria, UA FEW (*)    Casts HYALINE CASTS (*)    All other components within normal limits  CULTURE, BLOOD (ROUTINE X 2)  CULTURE, BLOOD (ROUTINE X 2)  URINE CULTURE  LACTIC ACID, PLASMA  D-DIMER, QUANTITATIVE   Imaging Review Dg Chest Port 1 View  06/20/2013   CLINICAL DATA:  42 year old female with shortness of breath. Cough. Initial encounter. Sarcoidosis.  EXAM: PORTABLE CHEST - 1 VIEW  COMPARISON:  11/23/2011.  FINDINGS: Portable AP upright view at 1510 hrs. Underlying chronic interstitial markings, but significantly increased widespread bilateral peri-bronchovascular and linear/streaky bilateral lung opacity. Both upper and lower lungs appear affected. Suggestion of some architectural distortion along the minor fissure. Stable cardiac size and mediastinal contours. No pneumothorax, pleural effusion or consolidation. Visualized tracheal  air column is within normal limits.  IMPRESSION: Progressed interstitial and perihilar opacity in both lungs. Differential considerations include progressed pulmonary sarcoidosis since 2013 versus viral/atypical respiratory infection superimposed on the chronic lung disease.   Electronically Signed   By: Augusto Gamble M.D.   On: 06/20/2013 15:19    EKG Interpretation   None       MDM   1. Bronchitis   2. Sarcoidosis      Evaluation for chest discomfort, with cough, does not reveal pneumonia. She remained afebrile emergency department. Chest x-ray is abnormal. Her extremities improved after treatment in the emergency department. Her oxygenation is normal with supplementation. Differential diagnosis includes exacerbation of sarcoidosis, bronchitis, exacerbation of COPD, and viral process.   The patient  appears reasonably screened and/or stabilized for discharge and I doubt any other medical condition or other [facility] requiring further screening, evaluation, or treatment in the ED at this time prior to discharge.  Nursing Notes Reviewed/ Care Coordinated, and agree without changes. Applicable Imaging Reviewed.  Interpretation of Laboratory Data incorporated into ED treatment  Plan: Home Medications- Zithromax; Home Treatments and Observation- rest; return here if the recommended treatment, does not improve the symptoms; Recommended follow up- Pulmonologist 1 week for check up   I personally performed the services described in this documentation, which was scribed in my presence. The recorded information has been reviewed and is accurate.     Flint Melter, MD 06/20/13 [phone]

## 2013-06-20 NOTE — ED Notes (Addendum)
Pt ambulated in hallway around nurse's station x 1 with standby assist. Pt c/o not feeling well, and feeling generally weak and slight sob. Denies pain/dizziness. O2 sats 87 % on ra upon returning to room, 93% on O2 at 2L [place]. nad noted.

## 2013-06-20 NOTE — ED Notes (Signed)
Sent from [facility], cough wbc 22.6  Sick for 2 days

## 2013-06-20 NOTE — ED Notes (Addendum)
EDP notified of nausea. Oral potassium held at this time until repeat dose of Zofran.

## 2013-06-21 LAB — CULTURE, BLOOD (SINGLE)

## 2013-06-21 LAB — URINE CULTURE
Colony Count: NO GROWTH
Culture: NO GROWTH

## 2013-06-25 LAB — CULTURE, BLOOD (ROUTINE X 2)
Culture: NO GROWTH
Culture: NO GROWTH

## 2013-07-19 ENCOUNTER — Ambulatory Visit: Payer: Medicaid Other | Admitting: Gastroenterology

## 2013-08-10 ENCOUNTER — Ambulatory Visit: Payer: Medicaid Other | Admitting: Gastroenterology

## 2013-08-10 ENCOUNTER — Telehealth: Payer: Self-pay | Admitting: Gastroenterology

## 2013-08-10 NOTE — Telephone Encounter (Signed)
Please send letter.

## 2013-08-10 NOTE — Telephone Encounter (Signed)
Pt was a no show

## 2013-08-16 ENCOUNTER — Encounter: Payer: Self-pay | Admitting: Gastroenterology

## 2013-08-16 NOTE — Telephone Encounter (Signed)
Mailed letter °

## 2013-10-18 ENCOUNTER — Ambulatory Visit (INDEPENDENT_AMBULATORY_CARE_PROVIDER_SITE_OTHER): Payer: Medicaid Other | Admitting: Gastroenterology

## 2013-10-18 ENCOUNTER — Telehealth: Payer: Self-pay | Admitting: Internal Medicine

## 2013-10-18 ENCOUNTER — Encounter: Payer: Self-pay | Admitting: Gastroenterology

## 2013-10-18 ENCOUNTER — Encounter (INDEPENDENT_AMBULATORY_CARE_PROVIDER_SITE_OTHER): Payer: Self-pay

## 2013-10-18 VITALS — BP 102/61 | HR 100 | Temp 98.0°F | Ht 68.0 in | Wt 130.4 lb

## 2013-10-18 DIAGNOSIS — G8929 Other chronic pain: Secondary | ICD-10-CM

## 2013-10-18 DIAGNOSIS — K5900 Constipation, unspecified: Secondary | ICD-10-CM | POA: Insufficient documentation

## 2013-10-18 DIAGNOSIS — R634 Abnormal weight loss: Secondary | ICD-10-CM

## 2013-10-18 DIAGNOSIS — Z8711 Personal history of peptic ulcer disease: Secondary | ICD-10-CM

## 2013-10-18 DIAGNOSIS — R1013 Epigastric pain: Secondary | ICD-10-CM

## 2013-10-18 MED ORDER — SUCRALFATE 1 GM/10ML PO SUSP: 1.0000 g | Freq: Three times a day (TID) | ORAL | Status: DC

## 2013-10-18 MED ORDER — LINACLOTIDE 290 MCG PO CAPS: 290.0000 ug | ORAL_CAPSULE | Freq: Every day | ORAL | Status: DC

## 2013-10-18 MED ORDER — DEXLANSOPRAZOLE 60 MG PO CPDR: 60.0000 mg | DELAYED_RELEASE_CAPSULE | Freq: Every day | ORAL | Status: DC

## 2013-10-18 NOTE — Telephone Encounter (Signed)
Pt was seen today by LSL and called back with questions about [facility]. She said that her PCP had prescribed it to her at one time and now her insurance will not cover it unless they know the prescription is from us. Please call to advise [phone]

## 2013-10-18 NOTE — Addendum Note (Signed)
Addended by: Tiffany KocherLEWIS, LESLIE S on: 10/18/2013 01:44 PM   Modules accepted: Orders

## 2013-10-18 NOTE — Assessment & Plan Note (Signed)
Lifelong constipation. Colonoscopy in her 2020s per patient. Trial of Linzess 290mcg daily.

## 2013-10-18 NOTE — Progress Notes (Signed)
Primary Care Physician: PROVIDER NOT IN SYSTEM  Primary Gastroenterologist:  Roetta SessionsMichael Rourk, MD   Chief Complaint  Patient presents with  . Abdominal Pain    HPI: Laura Haynes is a 43 y.o. female here for followup of persistent gastric ulcer. Previously evaluated by Dr. Lutricia FeilPaul [place] in [place]. Ulcer initially found on EGD October 2013 when patient presented with hemorrhage. Last EGD in October 2014 by Dr. Jena Gaussourk. She had extensive ulcerations/scarring/deformity of the antral/prepyloric gastric mucosa. Ongoing aspirin use expected to be the primary cause of the problem. Patient is also on chronic prednisone for her sarcoidosis for over 20 years. Gastric biopsy was benign. No evidence of H. Pylori. Pathology also mentioned iron deposits question possibility of iron pill induced ulcer.  Patient reports that she never has really improved. Few weeks ago, pain in epigastrium/substernal area with severe. Thought ulcer was going to rupture. Pain eased up some and has been managing. Dexilant ran out over a month ago. Staying away from acidic food. Not taking anything with aspirin products. Always has pain. No N/V. Poor appetite. C/o early satiety. Weight down 7 more pounds. Ensure or Boost one daily cannot afford anymore. Took iron supplements for about a month but stopped these on her own. All of life dealing with hard BMs. [place] 1. No melena, brbpr. Occasional fleet's enema. BM once every 10 days or so.     Current Outpatient Prescriptions  Medication Sig Dispense Refill  . albuterol (PROVENTIL HFA;VENTOLIN HFA) 108 (90 BASE) MCG/ACT inhaler Inhale 2 puffs into the lungs every 6 (six) hours as needed for wheezing or shortness of breath.      Marland Kitchen. albuterol (PROVENTIL) (2.5 MG/3ML) 0.083% nebulizer solution Take 2.5 mg by nebulization every 6 (six) hours as needed for wheezing.      Marland Kitchen. ALPRAZolam (XANAX) 1 MG tablet Take 1 mg by mouth 3 (three) times daily as needed for anxiety.       .  fentaNYL (DURAGESIC - DOSED MCG/HR) 75 MCG/HR Place 50 mcg onto the skin every 3 (three) days.       Marland Kitchen. ketotifen (ALAWAY) 0.025 % ophthalmic solution Place 1 drop into both eyes 2 (two) times daily.      Marland Kitchen. oxyCODONE-acetaminophen (PERCOCET) 10-325 MG per tablet Take 1 tablet by mouth every 4 (four) hours as needed for pain.      . polyvinyl alcohol-povidone (HYPOTEARS) 1.4-0.6 % ophthalmic solution Place 1-2 drops into both eyes as needed. Dry eyes      . predniSONE (DELTASONE) 10 MG tablet Take 10 mg by mouth daily.      Marland Kitchen. dexlansoprazole (DEXILANT) 60 MG capsule Take 60 mg by mouth daily.       No current facility-administered medications for this visit.    Allergies as of 10/18/2013 - Review Complete 10/18/2013  Allergen Reaction Noted  . Coreg [carvedilol]  11/23/2011  . Darvocet [propoxyphene n-acetaminophen]  11/23/2011  . Morphine and related  11/23/2011  . Motrin [ibuprofen]  11/23/2011  . Phenobarbital Other (See Comments) 11/23/2011   Past Medical History  Diagnosis Date  . Sarcoidosis     chronic pain medication  . 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 COPD (chronic obstructive pulmonary disease)   . Asthma   . Anxiety   . Oxygen dependent     prn basis   Past Surgical History  Procedure Laterality Date  . Cesarean section  1993  .  Lung biopsy  [id number],2008  . Tonsillectomy  2008  . Back surgery  2008  . Abdominal hysterectomy  2003  . Esophagogastroduodenoscopy  March 2014    Dr. Renae FicklePaul Oh: 1 crater gastric ulcer in the antrum. Esophagus normal. Biopsying benign, no H. pylori.  . Colonoscopy      age 43, [place]. normal per patient  . Esophagogastroduodenoscopy  January 2014    Dr. Renae FicklePaul Oh: Gastric ulcer, normal esophagus, biopsies benign, no H. pylori  . Esophagogastroduodenoscopy  October 2013    Dr. Renae FicklePaul Oh: Large gastric ulcer with visible vessel injected with epinephrine. No biopsy done.  . Esophagogastroduodenoscopy (egd) with  esophageal dilation N/A 04/13/2013    RMR: Extensive ulceration/ scarring/deformity of the antral/prepyloric gastric mucosa as described above-status post bx   Family History  Problem Relation Age of Onset  . Colon cancer Maternal Aunt     greater than age 45  . Liver disease Neg Hx   . Breast cancer Mother   . Lung cancer Father    History   Social History  . Marital Status: Married    Spouse Name: N/A    Number of Children: 1  . Years of Education: N/A   Occupational History  . disability    Social History Main Topics  . Smoking status: Current Every Day Smoker  . Smokeless tobacco: None     Comment: 1/2 pack a day  . Alcohol Use: No  . Drug Use: No  . Sexual Activity: None   Other Topics Concern  . None   Social History Narrative  . None    ROS:  General:no fever. See hpi ENT: Negative for hoarseness, difficulty swallowing , nasal congestion. CV: Negative for chest pain, angina, palpitations, dyspnea on exertion, peripheral edema.  Respiratory: Negative for dyspnea at rest, dyspnea on exertion, cough, sputum, wheezing.  GI: See history of present illness. GU:  Negative for dysuria, hematuria, urinary incontinence, urinary frequency, nocturnal urination.  Endo: see hpi  Physical Examination:   BP 102/61  Pulse 100  Temp(Src) 98 F (36.7 C) (Oral)  Ht 5\' 8"  (1.727 m)  Wt 130 lb 6.4 oz (59.149 kg)  BMI 19.83 kg/m2  General: Well-nourished, well-developed in no acute distress.  Eyes: No icterus. Mouth: Oropharyngeal mucosa moist and pink , no lesions erythema or exudate. Lungs: Diffuse wheezing  Heart: Regular rate and rhythm, no murmurs rubs or gallops.  Abdomen: Bowel sounds are normal, moderate epigastric tenderness, nondistended, no hepatosplenomegaly or masses, no abdominal bruits or hernia, no rebound or guarding.   Extremities: No lower extremity edema. No clubbing or deformities. Neuro: Alert and oriented x 4   Skin: Warm and dry, no jaundice.     Psych: Alert and cooperative, normal mood and affect.  Labs:  Lab Results  Component Value Date   WBC 14.9* 06/20/2013   HGB 10.6* 06/20/2013   HCT 32.6* 06/20/2013   MCV 95.6 06/20/2013   PLT 398 06/20/2013   Lab Results  Component Value Date   ALT 7 06/20/2013   AST 13 06/20/2013   ALKPHOS 116 06/20/2013   BILITOT 0.2* 06/20/2013    Imaging Studies: No results found.

## 2013-10-18 NOTE — Patient Instructions (Signed)
1. Please have your labs done. 2. Take Linzess one daily on empty stomach for constipation. 3. Restart Dexilant once daily before breakfast. 4. Carafate before meals and at bedtime. 5. Try Carnation Instant Breakfast twice daily if you are having trouble with meals.

## 2013-10-18 NOTE — Progress Notes (Signed)
No PCP 

## 2013-10-18 NOTE — Progress Notes (Signed)
Discussed with Dr. Jena Gaussourk. He wants to go ahead with scheduling EGD. Will need Phenergan 25mg  IV 30 minutes before procedure due to polypharmacy.  Still needs labs done. RMR also suggested H.Pylori serologies. Please add-on.

## 2013-10-18 NOTE — Assessment & Plan Note (Signed)
Persistent epigastric pain, with history of extensive ulceration of the stomach as outlined above. Ulcer persisting at least back to date of diagnosis in October 2013. Multiple EGDs without documented healing. Patient denies any aspirin use since her last EGD in October 2014. Unfortunately she is on chronic prednisone for more than 20 years for her sarcoidosis which may be contributing to the ongoing process in her stomach. No recent imaging available. Discussed with patient today. We will update her labs to rule out other etiologies of her pain, weight loss and a followup on her anemia. We will get her back on the Dexilant 60mg  daily. Carafate for another month or so. Once her labs are back we will decide how to reevaluate her stomach, ie UGI vs EGD vs CT.

## 2013-10-19 ENCOUNTER — Other Ambulatory Visit: Payer: Self-pay | Admitting: Internal Medicine

## 2013-10-19 DIAGNOSIS — Z8711 Personal history of peptic ulcer disease: Secondary | ICD-10-CM

## 2013-10-19 DIAGNOSIS — Z8719 Personal history of other diseases of the digestive system: Secondary | ICD-10-CM

## 2013-10-19 DIAGNOSIS — R109 Unspecified abdominal pain: Secondary | ICD-10-CM

## 2013-10-19 NOTE — Telephone Encounter (Signed)
PA is done and has been approved. Faxed approval to the pharmacy.

## 2013-10-19 NOTE — Progress Notes (Signed)
Patient is scheduled for EGD w/RMR on May 15th at 11:00 and I have mailed her instructions and she also stated she planned to do lab work 10/20/13

## 2013-10-19 NOTE — Progress Notes (Signed)
Lab order has been faxed to the lab 

## 2013-10-27 ENCOUNTER — Telehealth: Payer: Self-pay | Admitting: Gastroenterology

## 2013-10-27 NOTE — Telephone Encounter (Signed)
I spoke with pt- she said her stomach hurts just like it did during her ov. No N/V, no fever. The Linzess seems to be helping her bm's and she is doing ok with that. She is taking dexilant and feels like it helps more than the omeprazole did. Pt has not had her blood work done yet. She was going to have it done a few days before her EGD so RMR would have her results when he did her procedure on the 15th.   Pt wants to know if she can have a ct? Please advise.

## 2013-10-27 NOTE — Telephone Encounter (Signed)
I would like for her to go ahead with her labs now.  Would recommend EGD first and based on findings may consider CT at that point.  If pain worsens or becomes severe, has intractable vomiting, then go to ER.

## 2013-10-27 NOTE — Telephone Encounter (Signed)
Patient is calling c/o severe abdominal pain in the upper abdomen, cant eat, please advise

## 2013-10-28 NOTE — Telephone Encounter (Signed)
Pt is aware. She really wants to have the CT done now, she said she is tired of feeling bad and if the EGD doesn't show anything and she needs a CT anyway then she would prefer to go ahead and have it done now. Please advise.

## 2013-11-02 NOTE — Telephone Encounter (Signed)
Is patient planning on having her labs done? EGD scheduled for the 15th. Await findings before considering CT.

## 2013-11-03 ENCOUNTER — Telehealth: Payer: Self-pay

## 2013-11-03 NOTE — Telephone Encounter (Signed)
Pt canceled her EGD because she is in to much pain. She will call back to reschedule. Laura JonesCarolyn is aware

## 2013-11-03 NOTE — Telephone Encounter (Signed)
Noted  

## 2013-11-04 ENCOUNTER — Encounter (HOSPITAL_COMMUNITY): Admission: RE | Payer: Self-pay | Source: Ambulatory Visit

## 2013-11-04 ENCOUNTER — Ambulatory Visit (HOSPITAL_COMMUNITY): Admission: RE | Admit: 2013-11-04 | Payer: Medicaid Other | Source: Ambulatory Visit | Admitting: Internal Medicine

## 2013-11-04 SURGERY — EGD (ESOPHAGOGASTRODUODENOSCOPY)
Anesthesia: Moderate Sedation

## 2013-11-08 NOTE — Telephone Encounter (Signed)
Noted  

## 2013-11-10 NOTE — Telephone Encounter (Signed)
Pt cancelled procedure. Mailed reminder letter and a copy of the lab orders to pt.

## 2013-11-18 ENCOUNTER — Emergency Department (HOSPITAL_COMMUNITY)
Admission: EM | Admit: 2013-11-18 | Discharge: 2013-11-19 | Disposition: A | Discharge status: Home / Self Care | Payer: Medicaid Other | Attending: Emergency Medicine | Admitting: Emergency Medicine

## 2013-11-18 ENCOUNTER — Emergency Department (HOSPITAL_COMMUNITY): Payer: Medicaid Other

## 2013-11-18 ENCOUNTER — Encounter (HOSPITAL_COMMUNITY): Payer: Self-pay | Admitting: Emergency Medicine

## 2013-11-18 DIAGNOSIS — Z90710 Acquired absence of both cervix and uterus: Secondary | ICD-10-CM | POA: Insufficient documentation

## 2013-11-18 DIAGNOSIS — Z9981 Dependence on supplemental oxygen: Secondary | ICD-10-CM | POA: Insufficient documentation

## 2013-11-18 DIAGNOSIS — J4489 Other specified chronic obstructive pulmonary disease: Secondary | ICD-10-CM | POA: Insufficient documentation

## 2013-11-18 DIAGNOSIS — Z9889 Other specified postprocedural states: Secondary | ICD-10-CM | POA: Insufficient documentation

## 2013-11-18 DIAGNOSIS — Z8619 Personal history of other infectious and parasitic diseases: Secondary | ICD-10-CM | POA: Insufficient documentation

## 2013-11-18 DIAGNOSIS — J449 Chronic obstructive pulmonary disease, unspecified: Secondary | ICD-10-CM | POA: Insufficient documentation

## 2013-11-18 DIAGNOSIS — IMO0002 Reserved for concepts with insufficient information to code with codable children: Secondary | ICD-10-CM | POA: Insufficient documentation

## 2013-11-18 DIAGNOSIS — R1031 Right lower quadrant pain: Secondary | ICD-10-CM | POA: Insufficient documentation

## 2013-11-18 DIAGNOSIS — F17200 Nicotine dependence, unspecified, uncomplicated: Secondary | ICD-10-CM | POA: Insufficient documentation

## 2013-11-18 DIAGNOSIS — R911 Solitary pulmonary nodule: Secondary | ICD-10-CM | POA: Insufficient documentation

## 2013-11-18 DIAGNOSIS — F411 Generalized anxiety disorder: Secondary | ICD-10-CM | POA: Insufficient documentation

## 2013-11-18 DIAGNOSIS — R319 Hematuria, unspecified: Secondary | ICD-10-CM

## 2013-11-18 DIAGNOSIS — Z79899 Other long term (current) drug therapy: Secondary | ICD-10-CM | POA: Insufficient documentation

## 2013-11-18 DIAGNOSIS — R109 Unspecified abdominal pain: Secondary | ICD-10-CM

## 2013-11-18 LAB — URINE MICROSCOPIC-ADD ON

## 2013-11-18 LAB — URINALYSIS, ROUTINE W REFLEX MICROSCOPIC
BILIRUBIN URINE: NEGATIVE
Glucose, UA: NEGATIVE mg/dL
KETONES UR: NEGATIVE mg/dL
LEUKOCYTES UA: NEGATIVE
Nitrite: NEGATIVE
PROTEIN: NEGATIVE mg/dL
Specific Gravity, Urine: >1.03 — ABNORMAL HIGH (ref 1.005–1.030)
UROBILINOGEN UA: 0.2 mg/dL (ref 0.0–1.0)
pH: 5.5 (ref 5.0–8.0)

## 2013-11-18 MED ORDER — ONDANSETRON HCL 4 MG/2ML IJ SOLN
4.0000 mg | Freq: Once | INTRAMUSCULAR | Status: DC
  Filled 2013-11-18: qty 2

## 2013-11-18 MED ORDER — HYDROMORPHONE HCL PF 1 MG/ML IJ SOLN
1.0000 mg | Freq: Once | INTRAMUSCULAR | Status: AC
  Administered 2013-11-18: 1 mg via INTRAMUSCULAR
  Filled 2013-11-18: qty 1

## 2013-11-18 MED ORDER — ONDANSETRON 8 MG PO TBDP
8.0000 mg | ORAL_TABLET | Freq: Once | ORAL | Status: AC
  Administered 2013-11-18: 8 mg via ORAL
  Filled 2013-11-18: qty 1

## 2013-11-18 MED ORDER — FENTANYL CITRATE 0.05 MG/ML IJ SOLN
100.0000 ug | Freq: Once | INTRAMUSCULAR | Status: DC
  Filled 2013-11-18: qty 2

## 2013-11-18 MED ORDER — HYDROMORPHONE HCL PF 2 MG/ML IJ SOLN
2.0000 mg | Freq: Once | INTRAMUSCULAR | Status: AC
  Administered 2013-11-18: 2 mg via INTRAMUSCULAR
  Filled 2013-11-18: qty 1

## 2013-11-18 NOTE — ED Notes (Signed)
After flushing IV site and beginning to inject fentanyl, patient screamed in pain.  Palpated deep swelling at IV site.  Patient c/o of burning and pain at site.  Did not give remainder of medications.  IV site removed and Dr. Bebe Shaggy notified.

## 2013-11-18 NOTE — ED Provider Notes (Signed)
CSN: [id number]     Arrival date & time 11/18/13  2013 History  This chart was scribed for Joya Gaskinsonald W Jodeen Mclin, MD by Modena JanskyAlbert Thayil, ED Scribe. This patient was seen in room APA08/APA08 and the patient's care was started at 9:14 PM .   Chief Complaint  Patient presents with  . Flank Pain    Patient is a 43 y.o. female presenting with flank pain. The history is provided by the patient. No language interpreter was used.  Flank Pain This is a new problem. The current episode started more than 2 days ago. The problem has been gradually worsening. Associated symptoms include abdominal pain. Pertinent negatives include no shortness of breath. Nothing aggravates the symptoms. Nothing relieves the symptoms. She has tried a cold compress and a warm compress for the symptoms. The treatment provided no relief.    HPI Comments: Laura Haynes is a 43 y.o. female who presents to the Emergency Department complaining of intermittent, severe right flank pain that radiates to RLQ abdomen starting 3 days ago. She has applied ice and heat without relief of her pian. Pt visited her PCP in [place] yesterday for these symptoms. She had UA obtained and was told there was blood in her urine. She was advised to be evaluated in the ED for a kidney stone. She reports an associated subjective fever 2 nights ago. ED temperature is 98.8 F. She denies any new chest pain, SOB, dysuria, weakness/numbness or any other symptoms. She reports a hx of hysterectomy.  Past Medical History  Diagnosis Date  . Sarcoidosis     chronic pain medication  . 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 COPD (chronic obstructive pulmonary disease)   . Asthma   . Anxiety   . Oxygen dependent     prn basis   Past Surgical History  Procedure Laterality Date  . Cesarean section  1993  . Lung biopsy  [id number]  . Tonsillectomy  2008  . Back surgery  2008  . Abdominal hysterectomy  2003  .  Esophagogastroduodenoscopy  March 2014    Dr. Renae FicklePaul Oh: 1 crater gastric ulcer in the antrum. Esophagus normal. Biopsying benign, no H. pylori.  . Colonoscopy      age 43, [facility]. normal per patient  . Esophagogastroduodenoscopy  January 2014    Dr. Renae FicklePaul Oh: Gastric ulcer, normal esophagus, biopsies benign, no H. pylori  . Esophagogastroduodenoscopy  October 2013    Dr. Renae FicklePaul Oh: Large gastric ulcer with visible vessel injected with epinephrine. No biopsy done.  . Esophagogastroduodenoscopy (egd) with esophageal dilation N/A 04/13/2013    RMR: Extensive ulceration/ scarring/deformity of the antral/prepyloric gastric mucosa as described above-status post bx   Family History  Problem Relation Age of Onset  . Colon cancer Maternal Aunt     greater than age 45  . Liver disease Neg Hx   . Breast cancer Mother   . Lung cancer Father    History  Substance Use Topics  . Smoking status: Current Every Day Smoker  . Smokeless tobacco: Not on file     Comment: 1/2 pack a day  . Alcohol Use: No   OB History   Grav Para Term Preterm Abortions TAB SAB Ect Mult Living                 Review of Systems  Constitutional: Positive for fever (resolved).  Respiratory: Negative for shortness of breath.   Gastrointestinal: Positive  for abdominal pain.  Genitourinary: Positive for hematuria and flank pain. Negative for dysuria.  Neurological: Negative for weakness and numbness.  All other systems reviewed and are negative.     Allergies  Coreg; Darvocet; Morphine and related; Motrin; and Phenobarbital  Home Medications   Prior to Admission medications   Medication Sig Start Date End Date Taking? Authorizing Provider  albuterol (PROVENTIL HFA;VENTOLIN HFA) 108 (90 BASE) MCG/ACT inhaler Inhale 2 puffs into the lungs every 6 (six) hours as needed for wheezing or shortness of breath.    Historical Provider, MD  albuterol (PROVENTIL) (2.5 MG/3ML) 0.083% nebulizer solution Take 2.5 mg by  nebulization every 6 (six) hours as needed for wheezing.    Historical Provider, MD  ALPRAZolam Prudy Feeler) 1 MG tablet Take 1 mg by mouth 3 (three) times daily as needed for anxiety.     Historical Provider, MD  dexlansoprazole (DEXILANT) 60 MG capsule Take 1 capsule (60 mg total) by mouth daily. 10/18/13   Tiffany Kocher, PA-C  fentaNYL (DURAGESIC - DOSED MCG/HR) 75 MCG/HR Place 50 mcg onto the skin every 3 (three) days.     Historical Provider, MD  ketotifen (ALAWAY) 0.025 % ophthalmic solution Place 1 drop into both eyes 2 (two) times daily.    Historical Provider, MD  Linaclotide Karlene Einstein) 290 MCG CAPS capsule Take 1 capsule (290 mcg total) by mouth daily. 10/18/13   Tiffany Kocher, PA-C  oxyCODONE-acetaminophen (PERCOCET) 10-325 MG per tablet Take 1 tablet by mouth every 4 (four) hours as needed for pain.    Historical Provider, MD  polyvinyl alcohol-povidone (HYPOTEARS) 1.4-0.6 % ophthalmic solution Place 1-2 drops into both eyes as needed. Dry eyes    Historical Provider, MD  predniSONE (DELTASONE) 10 MG tablet Take 10 mg by mouth daily.    Historical Provider, MD  sucralfate (CARAFATE) 1 GM/10ML suspension Take 10 mLs (1 g total) by mouth 4 (four) times daily -  with meals and at bedtime. 10/18/13   Tiffany Kocher, PA-C   BP 125/70  Pulse 71  Temp(Src) 98.8 F (37.1 C) (Oral)  Resp 16  Ht 5\' 8"  (1.727 m)  Wt 128 lb (58.06 kg)  BMI 19.47 kg/m2  SpO2 100% Physical Exam  Nursing note and vitals reviewed.  CONSTITUTIONAL: Well developed/well nourished, anxious HEAD: Normocephalic/atraumatic EYES: EOMI/PERRL ENMT: Mucous membranes moist NECK: supple no meningeal signs SPINE:entire spine nontender CV: S1/S2 noted, no murmurs/rubs/gallops noted LUNGS: Lungs are clear to auscultation bilaterally, no apparent distress ABDOMEN: soft, nontender, no rebound or guarding GU: right cva tenderness NEURO: Pt is awake/alert, moves all extremitiesx4 EXTREMITIES: pulses normal, full ROM SKIN: warm,  color normal, fentanyl patch on back PSYCH: anxious   ED Course  Procedures  DIAGNOSTIC STUDIES: Oxygen Saturation is 100% on room air, normal by my interpretation.    COORDINATION OF CARE:   9:18 PM- Pt advised of plan for treatment, which includes medications, CT abdominal pelvis scan, and UA and pt agrees. 11:33 PM Pt informed of results Repeat exam reveals no RUQ tenderness Pt appears improved Discussed need to have repeat CT scan of chest to evaluate for mass. She has f/u with her pulmonologist for sarcoid next month She requests another dose of pain meds prior to d/c home Labs Review Labs Reviewed  URINALYSIS, ROUTINE W REFLEX MICROSCOPIC - Abnormal; Notable for the following:    Specific Gravity, Urine >1.030 (*)    Hgb urine dipstick SMALL (*)    All other components within normal limits  URINE  MICROSCOPIC-ADD ON    Imaging Review Ct Abdomen Pelvis Wo Contrast  11/18/2013   CLINICAL DATA:  43 year old female flank pain increasing. Initial encounter. Hematuria. History of sarcoidosis.  EXAM: CT ABDOMEN AND PELVIS WITHOUT CONTRAST  TECHNIQUE: Multidetector CT imaging of the abdomen and pelvis was performed following the standard protocol without IV contrast.  COMPARISON:  Portable chest radiograph 06/20/2013.  FINDINGS: Right lower lobe peri-bronchovascular nodular opacity measuring up to 15 x 26 x 14 mm. See series 3, image 10. This is not well demonstrated on today's scout view. A was not evident on the comparison although there was indistinct right lung base opacity at that time. Smaller more peripheral peribronchovascular nodularity nearby (series 3, image 12).  Mild mosaic attenuation at both lung bases. No pleural or pericardial effusion.  Scoliosis. Lower lumbar interbody fusion sequelae. No acute osseous abnormality identified.  Uterus surgically absent. Adnexal not clearly identified. There does appear to be a small volume of pelvic free fluid on the right (series 2,  image 73). Decompressed distal colon with no wall thickening. There may be some sigmoid diverticula. Negative left colon, splenic flexure (mildly redundant), transverse colon (redundant with retained stool), and right colon. The cecum and appendix are located in the pelvis and are normal. No dilated small bowel. Negative non contrast stomach and duodenum.  Negative non contrast liver, gallbladder, spleen, pancreas, adrenal glands. No abdominal free fluid.  No hydronephrosis or perinephric stranding. No nephrolithiasis. No hydroureter or periureteral stranding. Negative course of the right ureter. The bladder is decompressed and unremarkable. There are tiny pelvic phleboliths. No abdominal free fluid. Aortoiliac calcified atherosclerosis noted. No lymphadenopathy evident in the absence of IV contrast.  IMPRESSION: 1. No urologic calculus or obstructive uropathy. 2. There does appear to be a trace amount of pelvic free fluid which is nonspecific, and might be physiologic if this patient still has her ovaries and is premenopausal (uterus is surgically absent, ovaries not clearly delineated). 3. The appendix is normal. 4. Peribronchovascular nodularity and masslike opacity in the right lower lobe, could be related to chronic or recurrent inflammation in this patient with a history of sarcoidosis. However, without prior study to document stability follow-up is indicated. Follow-up chest CT at 3 months is probably most appropriate in this clinical setting. Alternatively, followup PET-CT could be utilized. This recommendation follows the consensus statement: Guidelines for Management of Small Pulmonary Nodules Detected on CT Scans: A Statement from the Fleischner Society as published in Radiology 2005; 237:395-400.   Electronically Signed   By: Augusto Gamble M.D.   On: 11/18/2013 23:07      MDM   Final diagnoses:  Flank pain  Hematuria  Pulmonary nodule    Nursing notes including past medical history and social  history reviewed and considered in documentation Labs/vital reviewed and considered   I personally performed the services described in this documentation, which was scribed in my presence. The recorded information has been reviewed and is accurate.        Joya Gaskins, MD 11/18/13 [phone]

## 2013-11-18 NOTE — ED Notes (Signed)
Right flank pain for 2-3 weeks,gradually worsening. Seen by PMD yesterday who suggested CT for R/O renal calc

## 2013-11-18 NOTE — Discharge Instructions (Signed)
PLEASE HAVE FOLLOWUP CAT SCAN OF YOUR CHEST WITH YOUR PULMONOLOGIST TO EVALUATE FOR POSSIBLE MASS IN YOUR RIGHT LUNG AS WE DISCUSSED  Abdominal (belly) pain can be caused by many things. any cases can be observed and treated at home after initial evaluation in the emergency department. Even though you are being discharged home, abdominal pain can be unpredictable. Therefore, you need a repeated exam if your pain does not resolve, returns, or worsens. Most patients with abdominal pain don't have to be admitted to the hospital or have surgery, but serious problems like appendicitis and gallbladder attacks can start out as nonspecific pain. Many abdominal conditions cannot be diagnosed in one visit, so follow-up evaluations are very important. SEEK IMMEDIATE MEDICAL ATTENTION IF: The pain does not go away or becomes severe, particularly over the next 8-12 hours.  A temperature above 100.40F develops.  Repeated vomiting occurs (multiple episodes).  The pain becomes localized to portions of the abdomen. The right side could possibly be appendicitis. In an adult, the left lower portion of the abdomen could be colitis or diverticulitis.  Blood is being passed in stools or vomit (bright red or black tarry stools).  Return also if you develop chest pain, difficulty breathing, dizziness or fainting, or become confused, poorly responsive, or inconsolable.

## 2013-12-26 ENCOUNTER — Emergency Department (HOSPITAL_COMMUNITY)
Admission: EM | Admit: 2013-12-26 | Discharge: 2013-12-26 | Disposition: A | Discharge status: Home / Self Care | Payer: Medicaid Other | Attending: Emergency Medicine | Admitting: Emergency Medicine

## 2013-12-26 ENCOUNTER — Emergency Department (HOSPITAL_COMMUNITY): Payer: Medicaid Other

## 2013-12-26 ENCOUNTER — Encounter (HOSPITAL_COMMUNITY): Payer: Self-pay | Admitting: Emergency Medicine

## 2013-12-26 DIAGNOSIS — F411 Generalized anxiety disorder: Secondary | ICD-10-CM | POA: Insufficient documentation

## 2013-12-26 DIAGNOSIS — Z79899 Other long term (current) drug therapy: Secondary | ICD-10-CM | POA: Diagnosis not present

## 2013-12-26 DIAGNOSIS — R569 Unspecified convulsions: Secondary | ICD-10-CM

## 2013-12-26 DIAGNOSIS — Z9981 Dependence on supplemental oxygen: Secondary | ICD-10-CM | POA: Insufficient documentation

## 2013-12-26 DIAGNOSIS — Z87891 Personal history of nicotine dependence: Secondary | ICD-10-CM | POA: Diagnosis not present

## 2013-12-26 DIAGNOSIS — J449 Chronic obstructive pulmonary disease, unspecified: Secondary | ICD-10-CM | POA: Insufficient documentation

## 2013-12-26 DIAGNOSIS — G40909 Epilepsy, unspecified, not intractable, without status epilepticus: Secondary | ICD-10-CM | POA: Diagnosis not present

## 2013-12-26 DIAGNOSIS — IMO0002 Reserved for concepts with insufficient information to code with codable children: Secondary | ICD-10-CM | POA: Insufficient documentation

## 2013-12-26 DIAGNOSIS — Z8619 Personal history of other infectious and parasitic diseases: Secondary | ICD-10-CM | POA: Insufficient documentation

## 2013-12-26 DIAGNOSIS — J4489 Other specified chronic obstructive pulmonary disease: Secondary | ICD-10-CM | POA: Insufficient documentation

## 2013-12-26 LAB — COMPREHENSIVE METABOLIC PANEL
ALBUMIN: 3.9 g/dL (ref 3.5–5.2)
ALT: 8 U/L (ref 0–35)
ANION GAP: 20 — AB (ref 5–15)
AST: 13 U/L (ref 0–37)
Alkaline Phosphatase: 69 U/L (ref 39–117)
BUN: 12 mg/dL (ref 6–23)
CO2: 18 mEq/L — ABNORMAL LOW (ref 19–32)
CREATININE: 0.63 mg/dL (ref 0.50–1.10)
Calcium: 9 mg/dL (ref 8.4–10.5)
Chloride: 105 mEq/L (ref 96–112)
GFR calc Af Amer: >90 mL/min (ref >90)
GFR calc non Af Amer: >90 mL/min (ref >90)
Glucose, Bld: 83 mg/dL (ref 70–99)
Potassium: 4.4 mEq/L (ref 3.7–5.3)
Sodium: 143 mEq/L (ref 137–147)
TOTAL PROTEIN: 6.6 g/dL (ref 6.0–8.3)
Total Bilirubin: <0.2 mg/dL — ABNORMAL LOW (ref 0.3–1.2)

## 2013-12-26 LAB — CBC WITH DIFFERENTIAL/PLATELET
Basophils Absolute: 0.1 10*3/uL (ref 0.0–0.1)
Basophils Relative: 1 % (ref 0–1)
Eosinophils Absolute: 0.2 10*3/uL (ref 0.0–0.7)
Eosinophils Relative: 2 % (ref 0–5)
HCT: 42.7 % (ref 36.0–46.0)
Hemoglobin: 14.3 g/dL (ref 12.0–15.0)
LYMPHS ABS: 2.9 10*3/uL (ref 0.7–4.0)
LYMPHS PCT: 27 % (ref 12–46)
MCH: 32.1 pg (ref 26.0–34.0)
MCHC: 33.5 g/dL (ref 30.0–36.0)
MCV: 96 fL (ref 78.0–100.0)
Monocytes Absolute: 0.7 10*3/uL (ref 0.1–1.0)
Monocytes Relative: 6 % (ref 3–12)
Neutro Abs: 6.8 10*3/uL (ref 1.7–7.7)
Neutrophils Relative %: 64 % (ref 43–77)
PLATELETS: 292 10*3/uL (ref 150–400)
RBC: 4.45 MIL/uL (ref 3.87–5.11)
RDW: 13.6 % (ref 11.5–15.5)
WBC: 10.6 10*3/uL — AB (ref 4.0–10.5)

## 2013-12-26 LAB — RAPID URINE DRUG SCREEN, HOSP PERFORMED
AMPHETAMINES: NOT DETECTED
BENZODIAZEPINES: POSITIVE — AB
Barbiturates: NOT DETECTED
Cocaine: NOT DETECTED
OPIATES: NOT DETECTED
Tetrahydrocannabinol: NOT DETECTED

## 2013-12-26 LAB — ETHANOL: Alcohol, Ethyl (B): <11 mg/dL (ref 0–11)

## 2013-12-26 LAB — PREGNANCY, URINE: Preg Test, Ur: NEGATIVE

## 2013-12-26 MED ORDER — THIAMINE HCL 100 MG/ML IJ SOLN
100.0000 mg | Freq: Every day | INTRAMUSCULAR | Status: DC
  Administered 2013-12-26: 100 mg via INTRAVENOUS
  Filled 2013-12-26: qty 2

## 2013-12-26 NOTE — Discharge Instructions (Signed)
Epilepsy °People with epilepsy have times when they shake and jerk uncontrollably (seizures). This happens when there is a sudden change in brain function. Epilepsy may have many possible causes. Anything that disturbs the normal pattern of brain cell activity can lead to seizures. °HOME CARE  °· Follow your doctor's instructions about driving and safety during normal activities. °· Get enough sleep. °· Only take medicine as told by your doctor. °· Avoid things that you know can cause you to have seizures (triggers). °· Write down when your seizures happen and what you remember about each seizure. Write down anything you think may have caused the seizure to happen. °· Tell the people you live and work with that you have seizures. Make sure they know how to help you. They should: °· Cushion your head and body. °· Turn you on your side. °· Not restrain you. °· Not place anything inside your mouth. °· Call for local emergency medical help if there is any question about what has happened. °· Keep all follow-up visits with your doctor. This is very important. °GET HELP IF: °· You get an infection or start to feel sick. You may have more seizures when you are sick. °· You are having seizures more often. °· Your seizure pattern is changing. °GET HELP RIGHT AWAY IF:  °· A seizure does not stop after a few seconds or minutes. °· A seizure causes you to have trouble breathing. °· A seizure gives you a very bad headache. °· A seizure makes you unable to speak or use a part of your body. °Document Released: 04/06/2009 Document Revised: 03/30/2013 Document Reviewed: 01/19/2013 °ExitCare® Patient Information ©2015 [employer]. This information is not intended to replace advice given to you by your health care provider. Make sure you discuss any questions you have with your health care provider. ° °Driving and Equipment Restrictions °Some medical problems make it dangerous to drive, ride a bike, or use machines. Some of these  problems are: °· A hard blow to the head (concussion). °· Passing out (fainting). °· Twitching and shaking (seizures). °· Low blood sugar. °· Taking medicine to help you relax (sedatives). °· Taking pain medicines. °· Wearing an eye patch. °· Wearing splints. This can make it hard to use parts of your body that you need to drive safely. °HOME CARE  °· Do not drive until your doctor says it is okay. °· Do not use machines until your doctor says it is okay. °You may need a form signed by your doctor (medical release) before you can drive again. You may also need this form before you do other tasks where you need to be fully alert. °MAKE SURE YOU: °· Understand these instructions. °· Will watch your condition. °· Will get help right away if you are not doing well or get worse. °Document Released: 07/17/2004 Document Revised: 09/01/2011 Document Reviewed: 10/17/2009 °ExitCare® Patient Information ©2015 [employer]. This information is not intended to replace advice given to you by your health care provider. Make sure you discuss any questions you have with your health care provider. ° °

## 2013-12-26 NOTE — ED Provider Notes (Signed)
CSN: [id number]     Arrival date & time 12/26/13  1615 History   First MD Initiated Contact with Patient 12/26/13 1618     Chief Complaint  Patient presents with  . Seizures    HPI Patient presents to the emergency room after having seizures. Patient has a history of seizures as a child but no recent history of seizure disorder. EMS was called after family found the patient having tonic-clonic generalized activity. This was witnessed by EMS personnel. This continued to until she arrived here in the emergency department. The patient has been somnolent since the seizure activity. The patient is able to answer some questions in the emergency department. She denies any recent injuries. She denies any recent illnesses. She feels very somnolent. She denies any focal weakness. Past Medical History  Diagnosis Date  . Sarcoidosis     chronic pain medication  . 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 COPD (chronic obstructive pulmonary disease)   . Asthma   . Anxiety   . Oxygen dependent     prn basis   Past Surgical History  Procedure Laterality Date  . Cesarean section  1993  . Lung biopsy  [id number]  . Tonsillectomy  2008  . Back surgery  2008  . Abdominal hysterectomy  2003  . Esophagogastroduodenoscopy  March 2014    Dr. Renae FicklePaul Oh: 1 crater gastric ulcer in the antrum. Esophagus normal. Biopsying benign, no H. pylori.  . Colonoscopy      age 43, [facility]. normal per patient  . Esophagogastroduodenoscopy  January 2014    Dr. Renae FicklePaul Oh: Gastric ulcer, normal esophagus, biopsies benign, no H. pylori  . Esophagogastroduodenoscopy  October 2013    Dr. Renae FicklePaul Oh: Large gastric ulcer with visible vessel injected with epinephrine. No biopsy done.  . Esophagogastroduodenoscopy (egd) with esophageal dilation N/A 04/13/2013    RMR: Extensive ulceration/ scarring/deformity of the antral/prepyloric gastric mucosa as described above-status post bx   Family History  Problem  Relation Age of Onset  . Colon cancer Maternal Aunt     greater than age 45  . Liver disease Neg Hx   . Breast cancer Mother   . Lung cancer Father    History  Substance Use Topics  . Smoking status: Former Smoker    Types: Cigarettes  . Smokeless tobacco: Not on file     Comment: 1/2 pack a day  . Alcohol Use: No   OB History   Grav Para Term Preterm Abortions TAB SAB Ect Mult Living                 Review of Systems  All other systems reviewed and are negative.     Allergies  Coreg; Darvocet; Morphine and related; Motrin; and Phenobarbital  Home Medications   Prior to Admission medications   Medication Sig Start Date End Date Taking? Authorizing Provider  albuterol (PROVENTIL HFA;VENTOLIN HFA) 108 (90 BASE) MCG/ACT inhaler Inhale 2 puffs into the lungs every 6 (six) hours as needed for wheezing or shortness of breath.   Yes Historical Provider, MD  albuterol (PROVENTIL) (2.5 MG/3ML) 0.083% nebulizer solution Take 2.5 mg by nebulization every 6 (six) hours as needed for wheezing.   Yes Historical Provider, MD  ALPRAZolam Prudy Feeler(XANAX) 1 MG tablet Take 1 mg by mouth 3 (three) times daily as needed for anxiety.    Yes Historical Provider, MD  dexlansoprazole (DEXILANT) 60 MG capsule Take 1 capsule (60  mg total) by mouth daily. 10/18/13  Yes Tiffany KocherLeslie S Lewis, PA-C  fentaNYL (DURAGESIC - DOSED MCG/HR) 75 MCG/HR Place 50 mcg onto the skin every other day.    Yes Historical Provider, MD  ketotifen (ALAWAY) 0.025 % ophthalmic solution Place 1 drop into both eyes 2 (two) times daily.   Yes Historical Provider, MD  oxyCODONE-acetaminophen (PERCOCET) 10-325 MG per tablet Take 1 tablet by mouth every 4 (four) hours as needed for pain.   Yes Historical Provider, MD  polyvinyl alcohol-povidone (HYPOTEARS) 1.4-0.6 % ophthalmic solution Place 1-2 drops into both eyes as needed. Dry eyes   Yes Historical Provider, MD  predniSONE (DELTASONE) 10 MG tablet Take 10 mg by mouth daily.   Yes Historical  Provider, MD  sucralfate (CARAFATE) 1 GM/10ML suspension Take 10 mLs (1 g total) by mouth 4 (four) times daily -  with meals and at bedtime. 10/18/13  Yes Tiffany KocherLeslie S Lewis, PA-C  vitamin B-12 (CYANOCOBALAMIN) 1000 MCG tablet Take 1,000 mcg by mouth daily.   Yes Historical Provider, MD   BP 113/62  Pulse 56  Temp(Src) 98.5 F (36.9 C) (Oral)  Resp 11  SpO2 97% Physical Exam  Nursing note and vitals reviewed. Constitutional: She appears well-developed and well-nourished. She appears lethargic. No distress.  HENT:  Head: Normocephalic and atraumatic.  Right Ear: External ear normal.  Left Ear: External ear normal.  Eyes: Conjunctivae are normal. Right eye exhibits no discharge. Left eye exhibits no discharge. No scleral icterus.  Neck: Neck supple. No tracheal deviation present.  Cardiovascular: Normal rate, regular rhythm and intact distal pulses.   Pulmonary/Chest: Effort normal and breath sounds normal. No stridor. No respiratory distress. She has no wheezes. She has no rales.  Abdominal: Soft. Bowel sounds are normal. She exhibits no distension. There is no tenderness. There is no rebound and no guarding.  Musculoskeletal: She exhibits no edema and no tenderness.  Neurological: She appears lethargic. She displays no tremor. No cranial nerve deficit (no facial droop, extraocular movements intact, no slurred speech) or sensory deficit. She exhibits normal muscle tone. She displays no seizure activity. Coordination abnormal. GCS eye subscore is 4. GCS verbal subscore is 4. GCS motor subscore is 6.  Patient is slow to respond, she does answer questions, she is not aware of the date or her location, she will follow commands and move all extremities although her movements are slow  Skin: Skin is warm and dry. No rash noted.  Psychiatric: She has a normal mood and affect.    ED Course  Procedures (including critical care time) Labs Review Labs Reviewed  CBC WITH DIFFERENTIAL - Abnormal;  Notable for the following:    WBC 10.6 (*)    All other components within normal limits  COMPREHENSIVE METABOLIC PANEL - Abnormal; Notable for the following:    CO2 18 (*)    Total Bilirubin <0.2 (*)    Anion gap 20 (*)    All other components within normal limits  ETHANOL  PREGNANCY, URINE  URINE RAPID DRUG SCREEN (HOSP PERFORMED)    Imaging Review Ct Head Wo Contrast  12/26/2013   CLINICAL DATA:  New onset of seizures.  EXAM: CT HEAD WITHOUT CONTRAST  TECHNIQUE: Contiguous axial images were obtained from the base of the skull through the vertex without contrast.  COMPARISON:  None  FINDINGS: Normal appearance of the intracranial structures. No evidence for acute hemorrhage, mass lesion, midline shift, hydrocephalus or large infarct. No acute bony abnormality. The visualized sinuses are clear.  Mild vascular calcification.  IMPRESSION: No acute intracranial abnormality.   Electronically Signed   By: Davonna Belling M.D.   On: 12/26/2013 17:44    Sinus rhythm noted on the monitor w 1808  Pt refused the ABG.     1858 patient is awake and alert. She is ready to go home. Her family is here at the bedside and will drive her home MDM   Final diagnoses:  Seizure    Is possible the patient did have a seizure today. Bicarbonate level is low and she did have some postictal confusion and somnolence. Patient has recovered during her observation the emergency department. His possible seizures could be related to the medications that she takes. I recommended she followup with a neurologist. Patient understands this. She is to avoid driving or operating any heavy equipment she is evaluated. Patient is also on chronic opiates and this is probably not safe for her to do anyway.    Linwood Dibbles, MD 12/26/13 [phone]

## 2013-12-26 NOTE — ED Notes (Addendum)
Pt to ED via EMS for new onset of seizure. Pt's husband reports to EMS that he was talking to pt suddenly not able to hear the pt; family went to the house and found pt seizing. Per EMS, pt was postictal at their arrival and had 2 seizure while on their truck. Pt arrived postictal at the ED; airway intact, alerts now and oriented x4 at this time. Hx of seizure during childhood. BP-138/94, HR-89, CBG-119.

## 2013-12-26 NOTE — ED Notes (Signed)
Pt. Stated, "I do not want the I-stat ABG."  Reported to Dr. Lynelle DoctorKnapp

## 2014-04-07 ENCOUNTER — Other Ambulatory Visit: Payer: Self-pay | Admitting: Urology

## 2014-04-07 ENCOUNTER — Ambulatory Visit (INDEPENDENT_AMBULATORY_CARE_PROVIDER_SITE_OTHER): Payer: BC Managed Care – PPO | Admitting: Urology

## 2014-04-07 DIAGNOSIS — R1011 Right upper quadrant pain: Secondary | ICD-10-CM

## 2014-04-07 DIAGNOSIS — M545 Low back pain: Secondary | ICD-10-CM

## 2014-04-07 DIAGNOSIS — R3129 Other microscopic hematuria: Secondary | ICD-10-CM

## 2014-04-07 DIAGNOSIS — R312 Other microscopic hematuria: Secondary | ICD-10-CM

## 2014-04-13 ENCOUNTER — Encounter (HOSPITAL_COMMUNITY)
Admission: RE | Admit: 2014-04-13 | Discharge: 2014-04-13 | Disposition: A | Discharge status: Home / Self Care | Payer: BC Managed Care – PPO | Source: Ambulatory Visit | Attending: Ophthalmology | Admitting: Ophthalmology

## 2014-04-13 ENCOUNTER — Encounter (HOSPITAL_COMMUNITY): Payer: Self-pay

## 2014-04-13 DIAGNOSIS — Z01818 Encounter for other preprocedural examination: Secondary | ICD-10-CM | POA: Diagnosis present

## 2014-04-13 HISTORY — DX: Shortness of breath: R06.02

## 2014-04-13 HISTORY — DX: Chronic or unspecified gastric ulcer with hemorrhage: K25.4

## 2014-04-13 LAB — BASIC METABOLIC PANEL
Anion gap: 15 (ref 5–15)
BUN: 19 mg/dL (ref 6–23)
CHLORIDE: 105 meq/L (ref 96–112)
CO2: 21 meq/L (ref 19–32)
Calcium: 9.7 mg/dL (ref 8.4–10.5)
Creatinine, Ser: 0.68 mg/dL (ref 0.50–1.10)
GFR calc Af Amer: >90 mL/min (ref >90)
GLUCOSE: 170 mg/dL — AB (ref 70–99)
Potassium: 4.2 mEq/L (ref 3.7–5.3)
SODIUM: 141 meq/L (ref 137–147)

## 2014-04-13 LAB — HEMOGLOBIN AND HEMATOCRIT, BLOOD
HCT: 43.3 % (ref 36.0–46.0)
Hemoglobin: 14.9 g/dL (ref 12.0–15.0)

## 2014-04-13 NOTE — Patient Instructions (Addendum)
Your procedure is scheduled on: 04/17/2014  Report to [facility] at  900  AM.  Call this number if you have problems the morning of surgery: [phone]   Do not eat food or drink liquids :After Midnight.      Take these medicines the morning of surgery with A SIP OF WATER: Proventil,Xanax, Dexilant and Percocet  Do not wear jewelry, make-up or nail polish.  Do not wear lotions, powders, or perfumes.   Do not shave 48 hours prior to surgery.  Do not bring valuables to the hospital.  Contacts, dentures or bridgework may not be worn into surgery.  Leave suitcase in the car. After surgery it may be brought to your room.  For patients admitted to the hospital, checkout time is 11:00 AM the day of discharge.   Patients discharged the day of surgery will not be allowed to drive home.  :     Please read over the following fact sheets that you were given: Coughing and Deep Breathing, Surgical Site Infection Prevention, Anesthesia Post-op Instructions and Care and Recovery After Surgery    Cataract A cataract is a clouding of the lens of the eye. When a lens becomes cloudy, vision is reduced based on the degree and nature of the clouding. Many cataracts reduce vision to some degree. Some cataracts make people more near-sighted as they develop. Other cataracts increase glare. Cataracts that are ignored and become worse can sometimes look white. The white color can be seen through the pupil. CAUSES   Aging. However, cataracts may occur at any age, even in newborns.   Certain drugs.   Trauma to the eye.   Certain diseases such as diabetes.   Specific eye diseases such as chronic inflammation inside the eye or a sudden attack of a rare form of glaucoma.   Inherited or acquired medical problems.  SYMPTOMS   Gradual, progressive drop in vision in the affected eye.   Severe, rapid visual loss. This most often happens when trauma is the cause.  DIAGNOSIS  To detect a cataract, an eye doctor  examines the lens. Cataracts are best diagnosed with an exam of the eyes with the pupils enlarged (dilated) by drops.  TREATMENT  For an early cataract, vision may improve by using different eyeglasses or stronger lighting. If that does not help your vision, surgery is the only effective treatment. A cataract needs to be surgically removed when vision loss interferes with your everyday activities, such as driving, reading, or watching TV. A cataract may also have to be removed if it prevents examination or treatment of another eye problem. Surgery removes the cloudy lens and usually replaces it with a substitute lens (intraocular lens, IOL).  At a time when both you and your doctor agree, the cataract will be surgically removed. If you have cataracts in both eyes, only one is usually removed at a time. This allows the operated eye to heal and be out of danger from any possible problems after surgery (such as infection or poor wound healing). In rare cases, a cataract may be doing damage to your eye. In these cases, your caregiver may advise surgical removal right away. The vast majority of people who have cataract surgery have better vision afterward. HOME CARE INSTRUCTIONS  If you are not planning surgery, you may be asked to do the following:  Use different eyeglasses.   Use stronger or brighter lighting.   Ask your eye doctor about reducing your medicine dose or changing  medicines if it is thought that a medicine caused your cataract. Changing medicines does not make the cataract go away on its own.   Become familiar with your surroundings. Poor vision can lead to injury. Avoid bumping into things on the affected side. You are at a higher risk for tripping or falling.   Exercise extreme care when driving or operating machinery.   Wear sunglasses if you are sensitive to bright [place] or experiencing problems with glare.  SEEK IMMEDIATE MEDICAL CARE IF:   You have a worsening or sudden vision  loss.   You notice redness, swelling, or increasing pain in the eye.   You have a fever.  Document Released: 06/09/2005 Document Revised: 05/29/2011 Document Reviewed: 01/31/2011 [facility] Patient Information 2012 [email or web address] INSTRUCTIONS POST-ANESTHESIA  IMMEDIATELY FOLLOWING SURGERY:  Do not drive or operate machinery for the first twenty four hours after surgery.  Do not make any important decisions for twenty four hours after surgery or while taking narcotic pain medications or sedatives.  If you develop intractable nausea and vomiting or a severe headache please notify your doctor immediately.  FOLLOW-UP:  Please make an appointment with your surgeon as instructed. You do not need to follow up with anesthesia unless specifically instructed to do so.  WOUND CARE INSTRUCTIONS (if applicable):  Keep a dry clean dressing on the anesthesia/puncture wound site if there is drainage.  Once the wound has quit draining you may leave it open to air.  Generally you should leave the bandage intact for twenty four hours unless there is drainage.  If the epidural site drains for more than 36-48 hours please call the anesthesia department.  QUESTIONS?:  Please feel free to call your physician or the hospital operator if you have any questions, and they will be happy to assist you.

## 2014-04-17 ENCOUNTER — Encounter (HOSPITAL_COMMUNITY)
Admission: RE | Disposition: A | Discharge status: Home / Self Care | Payer: Self-pay | Source: Ambulatory Visit | Attending: Ophthalmology

## 2014-04-17 ENCOUNTER — Ambulatory Visit (HOSPITAL_COMMUNITY): Payer: BC Managed Care – PPO | Admitting: Anesthesiology

## 2014-04-17 ENCOUNTER — Ambulatory Visit (HOSPITAL_COMMUNITY)
Admission: RE | Admit: 2014-04-17 | Discharge: 2014-04-17 | Disposition: A | Discharge status: Home / Self Care | Payer: BC Managed Care – PPO | Source: Ambulatory Visit | Attending: Ophthalmology | Admitting: Ophthalmology

## 2014-04-17 ENCOUNTER — Encounter (HOSPITAL_COMMUNITY): Payer: Self-pay | Admitting: *Deleted

## 2014-04-17 ENCOUNTER — Encounter (HOSPITAL_COMMUNITY): Payer: BC Managed Care – PPO | Admitting: Anesthesiology

## 2014-04-17 DIAGNOSIS — J449 Chronic obstructive pulmonary disease, unspecified: Secondary | ICD-10-CM | POA: Insufficient documentation

## 2014-04-17 DIAGNOSIS — F17200 Nicotine dependence, unspecified, uncomplicated: Secondary | ICD-10-CM | POA: Diagnosis not present

## 2014-04-17 DIAGNOSIS — F419 Anxiety disorder, unspecified: Secondary | ICD-10-CM | POA: Insufficient documentation

## 2014-04-17 DIAGNOSIS — Z9981 Dependence on supplemental oxygen: Secondary | ICD-10-CM | POA: Insufficient documentation

## 2014-04-17 DIAGNOSIS — H25812 Combined forms of age-related cataract, left eye: Secondary | ICD-10-CM | POA: Insufficient documentation

## 2014-04-17 DIAGNOSIS — K219 Gastro-esophageal reflux disease without esophagitis: Secondary | ICD-10-CM | POA: Diagnosis not present

## 2014-04-17 DIAGNOSIS — Z79899 Other long term (current) drug therapy: Secondary | ICD-10-CM | POA: Insufficient documentation

## 2014-04-17 HISTORY — PX: CATARACT EXTRACTION W/PHACO: SHX586

## 2014-04-17 SURGERY — PHACOEMULSIFICATION, CATARACT, WITH IOL INSERTION
Anesthesia: Monitor Anesthesia Care | Site: Eye | Laterality: Left

## 2014-04-17 MED ORDER — MIDAZOLAM HCL 2 MG/2ML IJ SOLN
INTRAMUSCULAR | Status: AC
  Filled 2014-04-17: qty 2

## 2014-04-17 MED ORDER — LIDOCAINE HCL 3.5 % OP GEL
1.0000 "application " | Freq: Once | OPHTHALMIC | Status: AC
  Administered 2014-04-17: 1 via OPHTHALMIC

## 2014-04-17 MED ORDER — POVIDONE-IODINE 5 % OP SOLN
OPHTHALMIC | Status: DC | PRN
  Administered 2014-04-17: 1 via OPHTHALMIC

## 2014-04-17 MED ORDER — FENTANYL CITRATE 0.05 MG/ML IJ SOLN
INTRAMUSCULAR | Status: DC | PRN
  Administered 2014-04-17 (×2): 50 ug via INTRAVENOUS

## 2014-04-17 MED ORDER — PHENYLEPHRINE HCL 2.5 % OP SOLN
1.0000 [drp] | OPHTHALMIC | Status: AC
  Administered 2014-04-17 (×3): 1 [drp] via OPHTHALMIC

## 2014-04-17 MED ORDER — BSS IO SOLN
INTRAOCULAR | Status: DC | PRN
  Administered 2014-04-17: 15 mL

## 2014-04-17 MED ORDER — FENTANYL CITRATE 0.05 MG/ML IJ SOLN
INTRAMUSCULAR | Status: AC
  Filled 2014-04-17: qty 2

## 2014-04-17 MED ORDER — PROVISC 10 MG/ML IO SOLN
INTRAOCULAR | Status: DC | PRN
  Administered 2014-04-17: 0.85 mL via INTRAOCULAR

## 2014-04-17 MED ORDER — MIDAZOLAM HCL 2 MG/2ML IJ SOLN
1.0000 mg | INTRAMUSCULAR | Status: AC | PRN
  Administered 2014-04-17 (×3): 2 mg via INTRAVENOUS

## 2014-04-17 MED ORDER — TETRACAINE HCL 0.5 % OP SOLN
1.0000 [drp] | OPHTHALMIC | Status: AC
  Administered 2014-04-17 (×3): 1 [drp] via OPHTHALMIC

## 2014-04-17 MED ORDER — EPINEPHRINE HCL 1 MG/ML IJ SOLN
INTRAOCULAR | Status: DC | PRN
  Administered 2014-04-17: 10:00:00

## 2014-04-17 MED ORDER — FENTANYL CITRATE 0.05 MG/ML IJ SOLN
25.0000 ug | INTRAMUSCULAR | Status: AC
  Administered 2014-04-17 (×2): 25 ug via INTRAVENOUS

## 2014-04-17 MED ORDER — CYCLOPENTOLATE-PHENYLEPHRINE 0.2-1 % OP SOLN
1.0000 [drp] | OPHTHALMIC | Status: AC
  Administered 2014-04-17 (×3): 1 [drp] via OPHTHALMIC

## 2014-04-17 MED ORDER — LACTATED RINGERS IV SOLN
INTRAVENOUS | Status: DC
  Administered 2014-04-17: 09:00:00 via INTRAVENOUS

## 2014-04-17 MED ORDER — LACTATED RINGERS IV SOLN
INTRAVENOUS | Status: DC | PRN
  Administered 2014-04-17: 10:00:00 via INTRAVENOUS

## 2014-04-17 MED ORDER — LIDOCAINE HCL (PF) 1 % IJ SOLN
INTRAMUSCULAR | Status: DC | PRN
  Administered 2014-04-17: .4 mL

## 2014-04-17 MED ORDER — NEOMYCIN-POLYMYXIN-DEXAMETH 3.5-10000-0.1 OP SUSP
OPHTHALMIC | Status: DC | PRN
  Administered 2014-04-17: 2 [drp] via OPHTHALMIC

## 2014-04-17 SURGICAL SUPPLY — 10 items
CLOTH BEACON ORANGE TIMEOUT ST (SAFETY) ×3 IMPLANT
EYE SHIELD UNIVERSAL CLEAR (GAUZE/BANDAGES/DRESSINGS) ×3 IMPLANT
GLOVE BIOGEL PI IND STRL 7.0 (GLOVE) ×2 IMPLANT
GLOVE BIOGEL PI INDICATOR 7.0 (GLOVE) ×4
PAD ARMBOARD 7.5X6 YLW CONV (MISCELLANEOUS) ×3 IMPLANT
SIGHTPATH CAT PROC W REG LENS (Ophthalmic Related) ×3 IMPLANT
SYRINGE LUER LOK 1CC (MISCELLANEOUS) ×3 IMPLANT
TAPE SURG TRANSPORE 1 IN (GAUZE/BANDAGES/DRESSINGS) ×1 IMPLANT
TAPE SURGICAL TRANSPORE 1 IN (GAUZE/BANDAGES/DRESSINGS) ×2
WATER STERILE IRR 250ML POUR (IV SOLUTION) ×3 IMPLANT

## 2014-04-17 NOTE — Transfer of Care (Signed)
Immediate Anesthesia Transfer of Care Note  Patient: Laura Haynes  Procedure(s) Performed: Procedure(s): CATARACT EXTRACTION PHACO AND INTRAOCULAR LENS PLACEMENT LEFT EYE CDE=4.91 (Left)  Patient Location: Short Stay  Anesthesia Type:MAC  Level of Consciousness: awake, alert , oriented and patient cooperative  Airway & Oxygen Therapy: Patient Spontanous Breathing  Post-op Assessment: Report given to PACU RN and Post -op Vital signs reviewed and stable  Post vital signs: Reviewed and stable  Complications: No apparent anesthesia complications

## 2014-04-17 NOTE — Op Note (Signed)
Date of Admission: 04/17/2014  Date of Surgery: 04/17/2014   Pre-Op Dx: Cataract Left Eye  Post-Op Dx: Senile Combined Cataract Left  Eye,  Dx Code Z61.096H25.812  Surgeon: Gemma PayorKerry Edder Bellanca, M.D.  Assistants: None  Anesthesia: Topical with MAC  Indications: Painless, progressive loss of vision with compromise of daily activities.  Surgery: Cataract Extraction with Intraocular lens Implant Left Eye  Discription: The patient had dilating drops and viscous lidocaine placed into the Left eye in the pre-op holding area. After transfer to the operating room, a time out was performed. The patient was then prepped and draped. Beginning with a 75 degree blade a paracentesis port was made at the surgeon's 2 o'clock position. The anterior chamber was then filled with 1% non-preserved lidocaine. This was followed by filling the anterior chamber with Provisc.  A 2.754mm keratome blade was used to make a clear corneal incision at the temporal limbus.  A bent cystatome needle was used to create a continuous tear capsulotomy. Hydrodissection was performed with balanced salt solution on a Fine canula. The lens nucleus was then removed using the phacoemulsification handpiece. Residual cortex was removed with the I&A handpiece. The anterior chamber and capsular bag were refilled with Provisc. A posterior chamber intraocular lens was placed into the capsular bag with it's injector. The implant was positioned with the Kuglan hook. A matching penetrating limbal incision was made at the nasal limbus to decrease astigmatism.  The Provisc was then removed from the anterior chamber and capsular bag with the I&A handpiece. Stromal hydration of the main incision and paracentesis port was performed with BSS on a Fine canula. The wounds were tested for leak which was negative. The patient tolerated the procedure well. There were no operative complications. The patient was then transferred to the recovery room in stable  condition.  Complications: None  Specimen: None  EBL: None  Prosthetic device: Hoya iSert 250, power 19.5 D, SN [id number].

## 2014-04-17 NOTE — Anesthesia Preprocedure Evaluation (Signed)
Anesthesia Evaluation  Patient identified by MRN, date of birth, ID band Patient awake    Reviewed: Allergy & Precautions, H&P , NPO status , Patient's Chart, lab work & pertinent test results  Airway Mallampati: II TM Distance: >3 FB     Dental  (+) Partial Upper   Pulmonary shortness of breath, asthma , COPD oxygen dependent, Current Smoker,  breath sounds clear to auscultation        Cardiovascular negative cardio ROS  Rhythm:Regular Rate:Normal     Neuro/Psych PSYCHIATRIC DISORDERS Anxiety    GI/Hepatic PUD, GERD-  ,  Endo/Other    Renal/GU      Musculoskeletal   Abdominal   Peds  Hematology   Anesthesia Other Findings   Reproductive/Obstetrics                           Anesthesia Physical Anesthesia Plan  ASA: III  Anesthesia Plan: MAC   Post-op Pain Management:    Induction: Intravenous  Airway Management Planned: Nasal Cannula  Additional Equipment:   Intra-op Plan:   Post-operative Plan:   Informed Consent: I have reviewed the patients History and Physical, chart, labs and discussed the procedure including the risks, benefits and alternatives for the proposed anesthesia with the patient or authorized representative who has indicated his/her understanding and acceptance.     Plan Discussed with:   Anesthesia Plan Comments:         Anesthesia Quick Evaluation

## 2014-04-17 NOTE — Discharge Instructions (Signed)

## 2014-04-17 NOTE — Anesthesia Postprocedure Evaluation (Signed)
  Anesthesia Post-op Note  Patient: Laura Haynes  Procedure(s) Performed: Procedure(s): CATARACT EXTRACTION PHACO AND INTRAOCULAR LENS PLACEMENT LEFT EYE CDE=4.91 (Left)  Patient Location: Short Stay  Anesthesia Type:MAC  Level of Consciousness: awake, alert , oriented and patient cooperative  Airway and Oxygen Therapy: Patient Spontanous Breathing  Post-op Pain: none  Post-op Assessment: Post-op Vital signs reviewed, Patient's Cardiovascular Status Stable, Respiratory Function Stable, Patent Airway, No signs of Nausea or vomiting and Pain level controlled  Post-op Vital Signs: Reviewed and stable  Last Vitals:  Filed Vitals:   04/17/14 1001  BP: 109/74  Pulse:   Temp:   Resp: 18    Complications: No apparent anesthesia complications

## 2014-04-17 NOTE — H&P (Signed)
I have reviewed the H&P, the patient was re-examined, and I have identified no interval changes in medical condition and plan of care since the history and physical of record  

## 2014-04-18 ENCOUNTER — Encounter (HOSPITAL_COMMUNITY): Payer: Self-pay | Admitting: Ophthalmology

## 2014-05-16 MED ORDER — FENTANYL CITRATE 0.05 MG/ML IJ SOLN: 25.0000 ug | INTRAMUSCULAR | Status: DC | PRN

## 2014-05-16 MED ORDER — ONDANSETRON HCL 4 MG/2ML IJ SOLN: 4.0000 mg | Freq: Once | INTRAMUSCULAR | Status: AC | PRN

## 2014-05-22 ENCOUNTER — Encounter (HOSPITAL_COMMUNITY)
Admission: RE | Admit: 2014-05-22 | Discharge: 2014-05-22 | Disposition: A | Discharge status: Home / Self Care | Payer: BC Managed Care – PPO | Source: Ambulatory Visit | Attending: Ophthalmology | Admitting: Ophthalmology

## 2014-05-22 NOTE — Pre-Procedure Instructions (Signed)
Attempted to reach patient for telephone interview.  Unable to reach at this time.

## 2014-05-23 ENCOUNTER — Encounter (HOSPITAL_COMMUNITY): Payer: Self-pay

## 2014-05-23 ENCOUNTER — Ambulatory Visit (HOSPITAL_COMMUNITY)
Admission: RE | Admit: 2014-05-23 | Discharge: 2014-05-23 | Disposition: A | Discharge status: Home / Self Care | Payer: BC Managed Care – PPO | Source: Ambulatory Visit | Attending: Urology | Admitting: Urology

## 2014-05-23 DIAGNOSIS — R3129 Other microscopic hematuria: Secondary | ICD-10-CM

## 2014-05-23 DIAGNOSIS — R109 Unspecified abdominal pain: Secondary | ICD-10-CM | POA: Insufficient documentation

## 2014-05-23 DIAGNOSIS — R312 Other microscopic hematuria: Secondary | ICD-10-CM | POA: Insufficient documentation

## 2014-05-23 DIAGNOSIS — R918 Other nonspecific abnormal finding of lung field: Secondary | ICD-10-CM | POA: Insufficient documentation

## 2014-05-23 DIAGNOSIS — D869 Sarcoidosis, unspecified: Secondary | ICD-10-CM | POA: Diagnosis not present

## 2014-05-23 MED ORDER — IOHEXOL 300 MG/ML  SOLN
150.0000 mL | Freq: Once | INTRAMUSCULAR | Status: AC | PRN
  Administered 2014-05-23: 150 mL via INTRAVENOUS

## 2014-05-24 ENCOUNTER — Encounter (HOSPITAL_COMMUNITY): Payer: Self-pay | Admitting: *Deleted

## 2014-05-24 MED ORDER — CYCLOPENTOLATE-PHENYLEPHRINE OP SOLN OPTIME - NO CHARGE
OPHTHALMIC | Status: AC
  Filled 2014-05-24: qty 2

## 2014-05-24 MED ORDER — NEOMYCIN-POLYMYXIN-DEXAMETH 3.5-10000-0.1 OP SUSP
OPHTHALMIC | Status: AC
  Filled 2014-05-24: qty 5

## 2014-05-24 MED ORDER — LIDOCAINE HCL 3.5 % OP GEL
OPHTHALMIC | Status: AC
  Filled 2014-05-24: qty 1

## 2014-05-24 MED ORDER — LIDOCAINE HCL (PF) 1 % IJ SOLN
INTRAMUSCULAR | Status: AC
  Filled 2014-05-24: qty 2

## 2014-05-24 MED ORDER — PHENYLEPHRINE HCL 2.5 % OP SOLN
OPHTHALMIC | Status: AC
  Filled 2014-05-24: qty 15

## 2014-05-24 MED ORDER — TETRACAINE HCL 0.5 % OP SOLN
OPHTHALMIC | Status: AC
  Filled 2014-05-24: qty 2

## 2014-05-25 ENCOUNTER — Ambulatory Visit (HOSPITAL_COMMUNITY)
Admission: RE | Admit: 2014-05-25 | Discharge: 2014-05-25 | Disposition: A | Discharge status: Home / Self Care | Payer: BC Managed Care – PPO | Source: Ambulatory Visit | Attending: Ophthalmology | Admitting: Ophthalmology

## 2014-05-25 ENCOUNTER — Encounter (HOSPITAL_COMMUNITY): Payer: Self-pay | Admitting: *Deleted

## 2014-05-25 ENCOUNTER — Ambulatory Visit (HOSPITAL_COMMUNITY): Payer: BC Managed Care – PPO | Admitting: Anesthesiology

## 2014-05-25 ENCOUNTER — Encounter (HOSPITAL_COMMUNITY)
Admission: RE | Disposition: A | Discharge status: Home / Self Care | Payer: Self-pay | Source: Ambulatory Visit | Attending: Ophthalmology

## 2014-05-25 DIAGNOSIS — F419 Anxiety disorder, unspecified: Secondary | ICD-10-CM | POA: Diagnosis not present

## 2014-05-25 DIAGNOSIS — Z9981 Dependence on supplemental oxygen: Secondary | ICD-10-CM | POA: Insufficient documentation

## 2014-05-25 DIAGNOSIS — Z886 Allergy status to analgesic agent status: Secondary | ICD-10-CM | POA: Diagnosis not present

## 2014-05-25 DIAGNOSIS — F17200 Nicotine dependence, unspecified, uncomplicated: Secondary | ICD-10-CM | POA: Insufficient documentation

## 2014-05-25 DIAGNOSIS — R634 Abnormal weight loss: Secondary | ICD-10-CM | POA: Insufficient documentation

## 2014-05-25 DIAGNOSIS — H25811 Combined forms of age-related cataract, right eye: Secondary | ICD-10-CM | POA: Insufficient documentation

## 2014-05-25 DIAGNOSIS — K259 Gastric ulcer, unspecified as acute or chronic, without hemorrhage or perforation: Secondary | ICD-10-CM | POA: Insufficient documentation

## 2014-05-25 DIAGNOSIS — Z888 Allergy status to other drugs, medicaments and biological substances status: Secondary | ICD-10-CM | POA: Insufficient documentation

## 2014-05-25 DIAGNOSIS — Z885 Allergy status to narcotic agent status: Secondary | ICD-10-CM | POA: Diagnosis not present

## 2014-05-25 DIAGNOSIS — R0602 Shortness of breath: Secondary | ICD-10-CM | POA: Diagnosis not present

## 2014-05-25 DIAGNOSIS — Z801 Family history of malignant neoplasm of trachea, bronchus and lung: Secondary | ICD-10-CM | POA: Diagnosis not present

## 2014-05-25 DIAGNOSIS — Z803 Family history of malignant neoplasm of breast: Secondary | ICD-10-CM | POA: Insufficient documentation

## 2014-05-25 DIAGNOSIS — K219 Gastro-esophageal reflux disease without esophagitis: Secondary | ICD-10-CM | POA: Insufficient documentation

## 2014-05-25 DIAGNOSIS — R1314 Dysphagia, pharyngoesophageal phase: Secondary | ICD-10-CM | POA: Insufficient documentation

## 2014-05-25 DIAGNOSIS — Z800 Family history of malignant neoplasm of digestive organs: Secondary | ICD-10-CM | POA: Insufficient documentation

## 2014-05-25 DIAGNOSIS — Z8711 Personal history of peptic ulcer disease: Secondary | ICD-10-CM | POA: Diagnosis not present

## 2014-05-25 DIAGNOSIS — J449 Chronic obstructive pulmonary disease, unspecified: Secondary | ICD-10-CM | POA: Diagnosis not present

## 2014-05-25 DIAGNOSIS — D869 Sarcoidosis, unspecified: Secondary | ICD-10-CM | POA: Diagnosis not present

## 2014-05-25 HISTORY — PX: CATARACT EXTRACTION W/PHACO: SHX586

## 2014-05-25 SURGERY — PHACOEMULSIFICATION, CATARACT, WITH IOL INSERTION
Anesthesia: Monitor Anesthesia Care | Site: Eye | Laterality: Right

## 2014-05-25 MED ORDER — FENTANYL CITRATE 0.05 MG/ML IJ SOLN
25.0000 ug | INTRAMUSCULAR | Status: AC
  Administered 2014-05-25 (×2): 25 ug via INTRAVENOUS

## 2014-05-25 MED ORDER — PHENYLEPHRINE HCL 2.5 % OP SOLN
1.0000 [drp] | OPHTHALMIC | Status: AC
  Administered 2014-05-25 (×3): 1 [drp] via OPHTHALMIC

## 2014-05-25 MED ORDER — EPINEPHRINE HCL 1 MG/ML IJ SOLN
INTRAOCULAR | Status: DC | PRN
  Administered 2014-05-25: 500 mL

## 2014-05-25 MED ORDER — MIDAZOLAM HCL 5 MG/5ML IJ SOLN
INTRAMUSCULAR | Status: DC | PRN
  Administered 2014-05-25: 2 mg via INTRAVENOUS

## 2014-05-25 MED ORDER — FENTANYL CITRATE 0.05 MG/ML IJ SOLN
INTRAMUSCULAR | Status: AC
  Filled 2014-05-25: qty 2

## 2014-05-25 MED ORDER — CYCLOPENTOLATE-PHENYLEPHRINE 0.2-1 % OP SOLN
1.0000 [drp] | OPHTHALMIC | Status: AC
  Administered 2014-05-25 (×3): 1 [drp] via OPHTHALMIC

## 2014-05-25 MED ORDER — NEOMYCIN-POLYMYXIN-DEXAMETH 3.5-10000-0.1 OP SUSP
OPHTHALMIC | Status: DC | PRN
  Administered 2014-05-25: 1 [drp] via OPHTHALMIC

## 2014-05-25 MED ORDER — LIDOCAINE HCL (PF) 1 % IJ SOLN
INTRAMUSCULAR | Status: DC | PRN
Start: 2014-05-25 — End: 2014-05-25
  Administered 2014-05-25: .7 mL

## 2014-05-25 MED ORDER — TETRACAINE HCL 0.5 % OP SOLN
1.0000 [drp] | OPHTHALMIC | Status: AC
  Administered 2014-05-25 (×3): 1 [drp] via OPHTHALMIC

## 2014-05-25 MED ORDER — LIDOCAINE HCL 3.5 % OP GEL
1.0000 "application " | Freq: Once | OPHTHALMIC | Status: AC
  Administered 2014-05-25: 1 via OPHTHALMIC

## 2014-05-25 MED ORDER — PROVISC 10 MG/ML IO SOLN
INTRAOCULAR | Status: DC | PRN
  Administered 2014-05-25: 0.85 mL via INTRAOCULAR

## 2014-05-25 MED ORDER — BSS IO SOLN
INTRAOCULAR | Status: DC | PRN
  Administered 2014-05-25: 30 mL via INTRAOCULAR

## 2014-05-25 MED ORDER — LIDOCAINE 3.5 % OP GEL OPTIME - NO CHARGE
OPHTHALMIC | Status: DC | PRN
  Administered 2014-05-25: 1 [drp] via OPHTHALMIC

## 2014-05-25 MED ORDER — EPINEPHRINE HCL 1 MG/ML IJ SOLN
INTRAMUSCULAR | Status: AC
  Filled 2014-05-25: qty 1

## 2014-05-25 MED ORDER — MIDAZOLAM HCL 2 MG/2ML IJ SOLN
INTRAMUSCULAR | Status: AC
  Filled 2014-05-25: qty 2

## 2014-05-25 MED ORDER — LACTATED RINGERS IV SOLN
INTRAVENOUS | Status: DC
  Administered 2014-05-25: 08:00:00 via INTRAVENOUS

## 2014-05-25 MED ORDER — POVIDONE-IODINE 5 % OP SOLN
OPHTHALMIC | Status: DC | PRN
  Administered 2014-05-25: 1 via OPHTHALMIC

## 2014-05-25 MED ORDER — MIDAZOLAM HCL 2 MG/2ML IJ SOLN
1.0000 mg | INTRAMUSCULAR | Status: DC | PRN
  Administered 2014-05-25: 2 mg via INTRAVENOUS

## 2014-05-25 SURGICAL SUPPLY — 34 items
CAPSULAR TENSION RING-AMO (OPHTHALMIC RELATED) IMPLANT
CLOTH BEACON ORANGE TIMEOUT ST (SAFETY) ×3 IMPLANT
EYE SHIELD UNIVERSAL CLEAR (GAUZE/BANDAGES/DRESSINGS) ×3 IMPLANT
GLOVE BIO SURGEON STRL SZ 6.5 (GLOVE) IMPLANT
GLOVE BIO SURGEONS STRL SZ 6.5 (GLOVE)
GLOVE BIOGEL PI IND STRL 6.5 (GLOVE) ×2 IMPLANT
GLOVE BIOGEL PI IND STRL 7.0 (GLOVE) IMPLANT
GLOVE BIOGEL PI IND STRL 7.5 (GLOVE) IMPLANT
GLOVE BIOGEL PI INDICATOR 6.5 (GLOVE) ×4
GLOVE BIOGEL PI INDICATOR 7.0 (GLOVE)
GLOVE BIOGEL PI INDICATOR 7.5 (GLOVE)
GLOVE ECLIPSE 6.5 STRL STRAW (GLOVE) IMPLANT
GLOVE ECLIPSE 7.0 STRL STRAW (GLOVE) IMPLANT
GLOVE ECLIPSE 7.5 STRL STRAW (GLOVE) IMPLANT
GLOVE EXAM NITRILE LRG STRL (GLOVE) IMPLANT
GLOVE EXAM NITRILE MD LF STRL (GLOVE) IMPLANT
GLOVE SKINSENSE NS SZ6.5 (GLOVE)
GLOVE SKINSENSE NS SZ7.0 (GLOVE)
GLOVE SKINSENSE STRL SZ6.5 (GLOVE) IMPLANT
GLOVE SKINSENSE STRL SZ7.0 (GLOVE) IMPLANT
KIT VITRECTOMY (OPHTHALMIC RELATED) IMPLANT
PAD ARMBOARD 7.5X6 YLW CONV (MISCELLANEOUS) ×3 IMPLANT
PROC W NO LENS (INTRAOCULAR LENS)
PROC W SPEC LENS (INTRAOCULAR LENS)
PROCESS W NO LENS (INTRAOCULAR LENS) IMPLANT
PROCESS W SPEC LENS (INTRAOCULAR LENS) IMPLANT
RETRACTOR IRIS SIGHTPATH (OPHTHALMIC RELATED) IMPLANT
RING MALYGIN (MISCELLANEOUS) IMPLANT
SIGHTPATH CAT PROC W REG LENS (Ophthalmic Related) ×3 IMPLANT
SYRINGE LUER LOK 1CC (MISCELLANEOUS) ×3 IMPLANT
TAPE SURG TRANSPORE 1 IN (GAUZE/BANDAGES/DRESSINGS) ×1 IMPLANT
TAPE SURGICAL TRANSPORE 1 IN (GAUZE/BANDAGES/DRESSINGS) ×2
VISCOELASTIC ADDITIONAL (OPHTHALMIC RELATED) IMPLANT
WATER STERILE IRR 250ML POUR (IV SOLUTION) ×3 IMPLANT

## 2014-05-25 NOTE — Op Note (Signed)
Date of Admission: 05/25/2014  Date of Surgery: 05/25/2014   Pre-Op Dx: Cataract Right Eye  Post-Op Dx: Senile Combined Cataract Right  Eye,  Dx Code Z61.096H25.811  Surgeon: Gemma PayorKerry Kamoria Lucien, M.D.  Assistants: None  Anesthesia: Topical with MAC  Indications: Painless, progressive loss of vision with compromise of daily activities.  Surgery: Cataract Extraction with Intraocular lens Implant Right Eye  Discription: The patient had dilating drops and viscous lidocaine placed into the Right eye in the pre-op holding area. After transfer to the operating room, a time out was performed. The patient was then prepped and draped. Beginning with a 75 degree blade a paracentesis port was made at the surgeon's 2 o'clock position. The anterior chamber was then filled with 1% non-preserved lidocaine. This was followed by filling the anterior chamber with Provisc.  A 2.764mm keratome blade was used to make a clear corneal incision at the temporal limbus.  A bent cystatome needle was used to create a continuous tear capsulotomy. Hydrodissection was performed with balanced salt solution on a Fine canula. The lens nucleus was then removed using the phacoemulsification handpiece. Residual cortex was removed with the I&A handpiece. The anterior chamber and capsular bag were refilled with Provisc. A posterior chamber intraocular lens was placed into the capsular bag with it's injector. The implant was positioned with the Kuglan hook. The Provisc was then removed from the anterior chamber and capsular bag with the I&A handpiece. Stromal hydration of the main incision and paracentesis port was performed with BSS on a Fine canula. The wounds were tested for leak which was negative. The patient tolerated the procedure well. There were no operative complications. The patient was then transferred to the recovery room in stable condition.  Complications: None  Specimen: None  EBL: None  Prosthetic device: Hoya iSert 250, power 19.5  D, SN [id number].

## 2014-05-25 NOTE — Anesthesia Postprocedure Evaluation (Signed)
  Anesthesia Post-op Note  Patient: Laura Haynes  Procedure(s) Performed: Procedure(s) with comments: CATARACT EXTRACTION PHACO AND INTRAOCULAR LENS PLACEMENT RIGHT EYE (Right) - CDE:3.09  Patient Location: Short Stay  Anesthesia Type:MAC  Level of Consciousness: awake, alert , oriented and patient cooperative  Airway and Oxygen Therapy: Patient Spontanous Breathing  Post-op Pain: none  Post-op Assessment: Post-op Vital signs reviewed, Patient's Cardiovascular Status Stable, Respiratory Function Stable, Patent Airway and No headache  Post-op Vital Signs: Reviewed and stable  Last Vitals:  Filed Vitals:   05/25/14 0809  BP: 140/80  Pulse: 88  Temp: 36.8 C  Resp: 18    Complications: No apparent anesthesia complications

## 2014-05-25 NOTE — H&P (Signed)
I have reviewed the H&P, the patient was re-examined, and I have identified no interval changes in medical condition and plan of care since the history and physical of record  

## 2014-05-25 NOTE — Discharge Instructions (Signed)

## 2014-05-25 NOTE — Transfer of Care (Signed)
Immediate Anesthesia Transfer of Care Note  Patient: Laura Haynes  Procedure(s) Performed: Procedure(s) with comments: CATARACT EXTRACTION PHACO AND INTRAOCULAR LENS PLACEMENT RIGHT EYE (Right) - CDE:3.09  Patient Location: Short Stay  Anesthesia Type:MAC  Level of Consciousness: awake, alert , oriented and patient cooperative  Airway & Oxygen Therapy: Patient Spontanous Breathing  Post-op Assessment: Report given to PACU RN, Post -op Vital signs reviewed and stable and Patient moving all extremities  Post vital signs: Reviewed and stable  Complications: No apparent anesthesia complications

## 2014-05-25 NOTE — Anesthesia Preprocedure Evaluation (Signed)
Anesthesia Evaluation  Patient identified by MRN, date of birth, ID band Patient awake    Reviewed: Allergy & Precautions, H&P , NPO status , Patient's Chart, lab work & pertinent test results  Airway Mallampati: II TM Distance: >3 FB     Dental  (+) Partial Upper   Pulmonary shortness of breath, asthma , COPD oxygen dependent, Current Smoker,  breath sounds clear to auscultation        Cardiovascular negative cardio ROS  Rhythm:Regular Rate:Normal     Neuro/Psych PSYCHIATRIC DISORDERS Anxiety    GI/Hepatic PUD, GERD-  ,  Endo/Other    Renal/GU      Musculoskeletal   Abdominal   Peds  Hematology   Anesthesia Other Findings   Reproductive/Obstetrics                           Anesthesia Physical Anesthesia Plan  ASA: III  Anesthesia Plan: MAC   Post-op Pain Management:    Induction: Intravenous  Airway Management Planned: Nasal Cannula  Additional Equipment:   Intra-op Plan:   Post-operative Plan:   Informed Consent: I have reviewed the patients History and Physical, chart, labs and discussed the procedure including the risks, benefits and alternatives for the proposed anesthesia with the patient or authorized representative who has indicated his/her understanding and acceptance.     Plan Discussed with:   Anesthesia Plan Comments:         Anesthesia Quick Evaluation  

## 2014-05-26 ENCOUNTER — Encounter (HOSPITAL_COMMUNITY): Payer: Self-pay | Admitting: Ophthalmology

## 2014-06-02 ENCOUNTER — Ambulatory Visit: Payer: BC Managed Care – PPO | Admitting: Urology

## 2014-06-14 ENCOUNTER — Emergency Department (HOSPITAL_COMMUNITY): Payer: BC Managed Care – PPO

## 2014-06-14 ENCOUNTER — Emergency Department (HOSPITAL_COMMUNITY)
Admission: EM | Admit: 2014-06-14 | Discharge: 2014-06-15 | Disposition: A | Discharge status: Home / Self Care | Payer: BC Managed Care – PPO | Attending: Emergency Medicine | Admitting: Emergency Medicine

## 2014-06-14 ENCOUNTER — Encounter (HOSPITAL_COMMUNITY): Payer: Self-pay | Admitting: *Deleted

## 2014-06-14 DIAGNOSIS — J449 Chronic obstructive pulmonary disease, unspecified: Secondary | ICD-10-CM | POA: Insufficient documentation

## 2014-06-14 DIAGNOSIS — W57XXXA Bitten or stung by nonvenomous insect and other nonvenomous arthropods, initial encounter: Secondary | ICD-10-CM | POA: Diagnosis not present

## 2014-06-14 DIAGNOSIS — Y9389 Activity, other specified: Secondary | ICD-10-CM | POA: Diagnosis not present

## 2014-06-14 DIAGNOSIS — R0789 Other chest pain: Secondary | ICD-10-CM | POA: Diagnosis not present

## 2014-06-14 DIAGNOSIS — R079 Chest pain, unspecified: Secondary | ICD-10-CM

## 2014-06-14 DIAGNOSIS — J45909 Unspecified asthma, uncomplicated: Secondary | ICD-10-CM | POA: Insufficient documentation

## 2014-06-14 DIAGNOSIS — Z79899 Other long term (current) drug therapy: Secondary | ICD-10-CM | POA: Diagnosis not present

## 2014-06-14 DIAGNOSIS — Z7952 Long term (current) use of systemic steroids: Secondary | ICD-10-CM | POA: Insufficient documentation

## 2014-06-14 DIAGNOSIS — Z9981 Dependence on supplemental oxygen: Secondary | ICD-10-CM | POA: Diagnosis not present

## 2014-06-14 DIAGNOSIS — S30861A Insect bite (nonvenomous) of abdominal wall, initial encounter: Secondary | ICD-10-CM | POA: Insufficient documentation

## 2014-06-14 DIAGNOSIS — Y998 Other external cause status: Secondary | ICD-10-CM | POA: Insufficient documentation

## 2014-06-14 DIAGNOSIS — Y9289 Other specified places as the place of occurrence of the external cause: Secondary | ICD-10-CM | POA: Diagnosis not present

## 2014-06-14 DIAGNOSIS — Z720 Tobacco use: Secondary | ICD-10-CM | POA: Insufficient documentation

## 2014-06-14 DIAGNOSIS — F419 Anxiety disorder, unspecified: Secondary | ICD-10-CM | POA: Insufficient documentation

## 2014-06-14 DIAGNOSIS — K250 Acute gastric ulcer with hemorrhage: Secondary | ICD-10-CM | POA: Insufficient documentation

## 2014-06-14 LAB — CBC WITH DIFFERENTIAL/PLATELET
BASOS PCT: 1 % (ref 0–1)
Basophils Absolute: 0.1 10*3/uL (ref 0.0–0.1)
Eosinophils Absolute: 0.3 10*3/uL (ref 0.0–0.7)
Eosinophils Relative: 2 % (ref 0–5)
HEMATOCRIT: 40.9 % (ref 36.0–46.0)
HEMOGLOBIN: 13.1 g/dL (ref 12.0–15.0)
Lymphocytes Relative: 30 % (ref 12–46)
Lymphs Abs: 4.2 10*3/uL — ABNORMAL HIGH (ref 0.7–4.0)
MCH: 31.3 pg (ref 26.0–34.0)
MCHC: 32 g/dL (ref 30.0–36.0)
MCV: 97.8 fL (ref 78.0–100.0)
Monocytes Absolute: 0.9 10*3/uL (ref 0.1–1.0)
Monocytes Relative: 6 % (ref 3–12)
NEUTROS ABS: 8.5 10*3/uL — AB (ref 1.7–7.7)
NEUTROS PCT: 61 % (ref 43–77)
PLATELETS: 378 10*3/uL (ref 150–400)
RBC: 4.18 MIL/uL (ref 3.87–5.11)
RDW: 13.5 % (ref 11.5–15.5)
WBC: 13.9 10*3/uL — AB (ref 4.0–10.5)

## 2014-06-14 LAB — COMPREHENSIVE METABOLIC PANEL
ALBUMIN: 4 g/dL (ref 3.5–5.2)
ALK PHOS: 68 U/L (ref 39–117)
ALT: 7 U/L (ref 0–35)
AST: 12 U/L (ref 0–37)
Anion gap: 8 (ref 5–15)
BUN: 19 mg/dL (ref 6–23)
CO2: 23 mmol/L (ref 19–32)
Calcium: 8.7 mg/dL (ref 8.4–10.5)
Chloride: 108 mEq/L (ref 96–112)
Creatinine, Ser: 0.61 mg/dL (ref 0.50–1.10)
GFR calc Af Amer: >90 mL/min (ref >90)
GFR calc non Af Amer: >90 mL/min (ref >90)
Glucose, Bld: 83 mg/dL (ref 70–99)
POTASSIUM: 3.9 mmol/L (ref 3.5–5.1)
SODIUM: 139 mmol/L (ref 135–145)
Total Bilirubin: 0.3 mg/dL (ref 0.3–1.2)
Total Protein: 6.9 g/dL (ref 6.0–8.3)

## 2014-06-14 LAB — BRAIN NATRIURETIC PEPTIDE: B Natriuretic Peptide: 22 pg/mL (ref 0.0–100.0)

## 2014-06-14 LAB — D-DIMER, QUANTITATIVE (NOT AT ARMC)

## 2014-06-14 LAB — TROPONIN I: Troponin I: <0.03 ng/mL (ref <0.031)

## 2014-06-14 NOTE — ED Provider Notes (Signed)
CSN: [id number]     Arrival date & time 06/14/14  2026 History  This chart was scribed for Glynn Octave, MD by Luisa Dago, ED Scribe. This patient was seen in room [facility]04/[facility]04 and the patient's care was started at 8:53 PM.    Chief Complaint  Patient presents with  . Chest Pain  . Tick Removal   The history is provided by the patient. No language interpreter was used.    HPI Comments: Laura Haynes is a 43 y.o. female with hx of Sarcoidosis, COPD, and asthama presents to the Emergency Department complaining of gradual onset constant left sided chest pain that started approximately 3 days ago. Positive SOB, dry cough, and one episode of diarrhea 4 days ago. Pt is also requesting a tick removal. Tick bite to right flank which she noted 2 hours ago. She is unable to specify the duration of the intermittent episodes of chest pain. However, she states that the chest pain radiates into her left shoulder. Pain is not exacerbated by any specific movements or activities. Denies any abdominal pain, fever, chills, nausea, HA, numbness, diaphoresis, paraesthesia, hx of kidney failure,or wheezing.   Past Medical History  Diagnosis Date  . Sarcoidosis     chronic pain medication  . 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 COPD (chronic obstructive pulmonary disease)   . Asthma   . Anxiety   . Oxygen dependent     prn basis  . Shortness of breath   . Gastric ulcer with hemorrhage 2013    gastric ulcer surgery   Past Surgical History  Procedure Laterality Date  . Cesarean section  1993  . Lung biopsy  [id number]  . Tonsillectomy  2008  . Back surgery  2008  . Abdominal hysterectomy  2003  . Esophagogastroduodenoscopy  March 2014    Dr. Renae Fickle Oh: 1 crater gastric ulcer in the antrum. Esophagus normal. Biopsying benign, no H. pylori.  . Colonoscopy      age 64, 37. normal per patient  . Esophagogastroduodenoscopy  January 2014    Dr. Renae Fickle Oh:  Gastric ulcer, normal esophagus, biopsies benign, no H. pylori  . Esophagogastroduodenoscopy  October 2013    Dr. Renae Fickle Oh: Large gastric ulcer with visible vessel injected with epinephrine. No biopsy done.  . Esophagogastroduodenoscopy (egd) with esophageal dilation N/A 04/13/2013    RMR: Extensive ulceration/ scarring/deformity of the antral/prepyloric gastric mucosa as described above-status post bx  . Cataract extraction w/phaco Left 04/17/2014    Procedure: CATARACT EXTRACTION PHACO AND INTRAOCULAR LENS PLACEMENT LEFT EYE CDE=4.91;  Surgeon: Gemma Payor, MD;  Location: [facility];  Service: Ophthalmology;  Laterality: Left;  . Cataract extraction w/phaco Right 05/25/2014    Procedure: CATARACT EXTRACTION PHACO AND INTRAOCULAR LENS PLACEMENT RIGHT EYE;  Surgeon: Gemma Payor, MD;  Location: [facility];  Service: Ophthalmology;  Laterality: Right;  CDE:3.09   Family History  Problem Relation Age of Onset  . Colon cancer Maternal Aunt     greater than age 70  . Liver disease Neg Hx   . Breast cancer Mother   . Lung cancer Father    History  Substance Use Topics  . Smoking status: Smoker, Current Status Unknown -- 0.25 packs/day    Types: Cigarettes  . Smokeless tobacco: Not on file     Comment: 1/2 pack a day  . Alcohol Use: No   OB History    No data available  Review of Systems A complete 10 system review of systems was obtained and all systems are negative except as noted in the HPI and PMH.     Allergies  Coreg; Darvocet; Morphine and related; Motrin; and Phenobarbital  Home Medications   Prior to Admission medications   Medication Sig Start Date End Date Taking? Authorizing Provider  albuterol (PROVENTIL HFA;VENTOLIN HFA) 108 (90 BASE) MCG/ACT inhaler Inhale 2 puffs into the lungs every 6 (six) hours as needed for wheezing or shortness of breath.   Yes Historical Provider, MD  albuterol (PROVENTIL) (2.5 MG/3ML) 0.083% nebulizer solution Take 2.5 mg by nebulization every 6  (six) hours as needed for wheezing.   Yes Historical Provider, MD  ALPRAZolam Prudy Feeler(XANAX) 0.5 MG tablet Take 0.5 mg by mouth 3 (three) times daily as needed for anxiety.   Yes Historical Provider, MD  BESIVANCE 0.6 % SUSP Place 1 drop into the right eye 2 (two) times daily. 05/23/14  Yes Historical Provider, MD  dexlansoprazole (DEXILANT) 60 MG capsule Take 1 capsule (60 mg total) by mouth daily. 10/18/13  Yes Tiffany KocherLeslie S Lewis, PA-C  fentaNYL (DURAGESIC - DOSED MCG/HR) 75 MCG/HR Place 50 mcg onto the skin every other day.    Yes Historical Provider, MD  oxyCODONE-acetaminophen (PERCOCET) 7.5-325 MG per tablet Take 1 tablet by mouth 2 (two) times daily as needed for pain.   Yes Historical Provider, MD  polyvinyl alcohol-povidone (HYPOTEARS) 1.4-0.6 % ophthalmic solution Place 1-2 drops into both eyes as needed. Dry eyes   Yes Historical Provider, MD  predniSONE (DELTASONE) 10 MG tablet Take 10 mg by mouth daily.   Yes Historical Provider, MD  promethazine (PHENERGAN) 25 MG tablet Take 25 mg by mouth every 4 (four) hours as needed. FOR NAUSEA AND VOMITING 06/08/14  Yes Historical Provider, MD  SAPHRIS 5 MG SUBL 24 hr tablet Place 5 mg under the tongue 2 (two) times daily. 06/08/14  Yes Historical Provider, MD  sucralfate (CARAFATE) 1 GM/10ML suspension Take 10 mLs (1 g total) by mouth 4 (four) times daily -  with meals and at bedtime. 10/18/13  Yes Tiffany KocherLeslie S Lewis, PA-C  doxycycline (VIBRAMYCIN) 100 MG capsule Take 1 capsule (100 mg total) by mouth 2 (two) times daily. 06/15/14   Glynn OctaveStephen Siddiq Kaluzny, MD   Triage Vitals:BP 134/66 mmHg  Pulse 76  Temp(Src) 98.4 F (36.9 C) (Oral)  Resp 20  Ht 5' 8.5" (1.74 m)  Wt 115 lb (52.164 kg)  BMI 17.23 kg/m2  SpO2 98%  Physical Exam  Constitutional: She is oriented to person, place, and time. She appears well-developed and well-nourished. No distress.  HENT:  Head: Normocephalic and atraumatic.  Mouth/Throat: Oropharynx is clear and moist. No oropharyngeal exudate.   Eyes: Conjunctivae and EOM are normal. Pupils are equal, round, and reactive to light.  Neck: Normal range of motion. Neck supple.  No meningismus.  Cardiovascular: Normal rate, regular rhythm, normal heart sounds and intact distal pulses.   No murmur heard. Pulmonary/Chest: Effort normal and breath sounds normal. No respiratory distress.  Scattered rhonchi. Left chest wall tenderness.  Abdominal: Soft. There is no tenderness. There is no rebound and no guarding.  Musculoskeletal: Normal range of motion. She exhibits no edema or tenderness.  Neurological: She is alert and oriented to person, place, and time. No cranial nerve deficit. She exhibits normal muscle tone. Coordination normal.  No ataxia on finger to nose bilaterally. No pronator drift. 5/5 strength throughout. CN 2-12 intact. Negative Romberg. Equal grip strength. Sensation intact. Gait  is normal.   Skin: Skin is warm.  She has a tick attached to right flank with surrounding erythema.   Psychiatric: She has a normal mood and affect. Her behavior is normal.  Nursing note and vitals reviewed.   ED Course  FOREIGN BODY REMOVAL Date/Time: 06/15/2014 12:12 AM Performed by: Glynn Octave Authorized by: Glynn Octave Consent: Verbal consent obtained. Risks and benefits: risks, benefits and alternatives were discussed Consent given by: patient Patient understanding: patient states understanding of the procedure being performed Patient consent: the patient's understanding of the procedure matches consent given Procedure consent: procedure consent matches procedure scheduled Relevant documents: relevant documents present and verified Test results: test results available and properly labeled Site marked: the operative site was marked Patient identity confirmed: verbally with patient and provided demographic data Body area: skin General location: trunk Location details: right flank Patient sedated: no Patient restrained:  no Patient cooperative: yes Removal mechanism: forceps and hemostat Dressing: antibiotic ointment and dressing applied Tendon involvement: none Depth: subcutaneous Complexity: simple 1 objects recovered. Objects recovered: tick Post-procedure assessment: foreign body removed Patient tolerance: Patient tolerated the procedure well with no immediate complications   (including critical care time)  DIAGNOSTIC STUDIES: Oxygen Saturation is 98% on RA, normal by my interpretation.    COORDINATION OF CARE: 8:59 PM-Thick will be removed as per pt's request. Pt advised of plan for treatment and pt agrees.  Labs Review Labs Reviewed  CBC WITH DIFFERENTIAL - Abnormal; Notable for the following:    WBC 13.9 (*)    Neutro Abs 8.5 (*)    Lymphs Abs 4.2 (*)    All other components within normal limits  COMPREHENSIVE METABOLIC PANEL  TROPONIN I  BRAIN NATRIURETIC PEPTIDE  D-DIMER, QUANTITATIVE  TROPONIN I    Imaging Review Dg Chest 2 View  06/14/2014   CLINICAL DATA:  Chest pain for 1 day.  History of sarcoidosis  EXAM: CHEST  2 VIEW  COMPARISON:  06/20/2013 chest radiograph, CT abdomen/pelvis 11/19/2014  FINDINGS: Lungs are hyperinflated with biapical scarring reidentified. Right lower lobe nodularity is subjectively stable since the prior dissimilar exam 11/18/2013. No pleural effusion. Heart size is normal. No acute osseous finding.  IMPRESSION: Biapical scarring compatible with the provided history of sarcoidosis.  Right lower lobe nodularity again noted, without new acute abnormality.   Electronically Signed   By: Christiana Pellant M.D.   On: 06/14/2014 22:54     EKG Interpretation   Date/Time:  Wednesday June 14 2014 20:46:42 EST Ventricular Rate:  59 PR Interval:  129 QRS Duration: 98 QT Interval:  419 QTC Calculation: 415 R Axis:   79 Text Interpretation:  Sinus arrhythmia Abnrm T, consider ischemia,  anterolateral lds No significant change was found Confirmed by Manus Gunning    MD, Shirlette Scarber [phone]) on 06/14/2014 8:50:45 PM      MDM   Final diagnoses:  Tick bite with subsequent removal of tick  Atypical chest pain   Patient requests tick removal from right flank. She endorses constant left-sided chest pain for the past 3 days associated shortness of breath and nausea. She has a history of sarcoidosis. Denies any history of CAD.  EKG unchanged.  Chest wall nontender.  CXR unchanged.  D-dimer negative.  Pain seems atypical for ACS and has been constant x 3 days.  Nothing makes it better or worse. Unchanged EKG and troponin negative x2.  Tick removed as aboved.  Unknown duration of attachment.  Empiric doxycycline given.  Return precautions discussed.  I personally  performed the services described in this documentation, which was scribed in my presence. The recorded information has been reviewed and is accurate.    Glynn OctaveStephen Burgandy Hackworth, MD 06/15/14 [phone]

## 2014-06-14 NOTE — ED Notes (Signed)
Pt with tick bite and still attached to right flank noted an hour ago, states tick removed from under left breast earlier as well

## 2014-06-14 NOTE — ED Notes (Addendum)
Pt also states left chest pain for 3 days, + sob, denies N/V, + diarrhea on Sunday

## 2014-06-15 LAB — TROPONIN I: Troponin I: <0.03 ng/mL (ref <0.031)

## 2014-06-15 MED ORDER — DOXYCYCLINE HYCLATE 100 MG PO CAPS: 100.0000 mg | ORAL_CAPSULE | Freq: Two times a day (BID) | ORAL | Status: DC

## 2014-06-15 NOTE — Discharge Instructions (Signed)
Chest Pain (Nonspecific) °There is no evidence of heart attack or blood clot in the lung. Follow up with your doctor. Return to the ED if you develop new or worsening symptoms. °It is often hard to give a specific diagnosis for the cause of chest pain. There is always a chance that your pain could be related to something serious, such as a heart attack or a blood clot in the lungs. You need to follow up with your health care provider for further evaluation. °CAUSES  °· Heartburn. °· Pneumonia or bronchitis. °· Anxiety or stress. °· Inflammation around your heart (pericarditis) or lung (pleuritis or pleurisy). °· A blood clot in the lung. °· A collapsed lung (pneumothorax). It can develop suddenly on its own (spontaneous pneumothorax) or from trauma to the chest. °· Shingles infection (herpes zoster virus). °The chest wall is composed of bones, muscles, and cartilage. Any of these can be the source of the pain. °· The bones can be bruised by injury. °· The muscles or cartilage can be strained by coughing or overwork. °· The cartilage can be affected by inflammation and become sore (costochondritis). °DIAGNOSIS  °Lab tests or other studies may be needed to find the cause of your pain. Your health care provider may have you take a test called an ambulatory electrocardiogram (ECG). An ECG records your heartbeat patterns over a 24-hour period. You may also have other tests, such as: °· Transthoracic echocardiogram (TTE). During echocardiography, sound waves are used to evaluate how blood flows through your heart. °· Transesophageal echocardiogram (TEE). °· Cardiac monitoring. This allows your health care provider to monitor your heart rate and rhythm in real time. °· Holter monitor. This is a portable device that records your heartbeat and can help diagnose heart arrhythmias. It allows your health care provider to track your heart activity for several days, if needed. °· Stress tests by exercise or by giving medicine  that makes the heart beat faster. °TREATMENT  °· Treatment depends on what may be causing your chest pain. Treatment may include: °¨ Acid blockers for heartburn. °¨ Anti-inflammatory medicine. °¨ Pain medicine for inflammatory conditions. °¨ Antibiotics if an infection is present. °· You may be advised to change lifestyle habits. This includes stopping smoking and avoiding alcohol, caffeine, and chocolate. °· You may be advised to keep your head raised (elevated) when sleeping. This reduces the chance of acid going backward from your stomach into your esophagus. °Most of the time, nonspecific chest pain will improve within 2-3 days with rest and mild pain medicine.  °HOME CARE INSTRUCTIONS  °· If antibiotics were prescribed, take them as directed. Finish them even if you start to feel better. °· For the next few days, avoid physical activities that bring on chest pain. Continue physical activities as directed. °· Do not use any tobacco products, including cigarettes, chewing tobacco, or electronic cigarettes. °· Avoid drinking alcohol. °· Only take medicine as directed by your health care provider. °· Follow your health care provider's suggestions for further testing if your chest pain does not go away. °· Keep any follow-up appointments you made. If you do not go to an appointment, you could develop lasting (chronic) problems with pain. If there is any problem keeping an appointment, call to reschedule. °SEEK MEDICAL CARE IF:  °· Your chest pain does not go away, even after treatment. °· You have a rash with blisters on your chest. °· You have a fever. °SEEK IMMEDIATE MEDICAL CARE IF:  °· You have increased   chest pain or pain that spreads to your arm, neck, jaw, back, or abdomen. °· You have shortness of breath. °· You have an increasing cough, or you cough up blood. °· You have severe back or abdominal pain. °· You feel nauseous or vomit. °· You have severe weakness. °· You faint. °· You have chills. °This is an  emergency. Do not wait to see if the pain will go away. Get medical help at once. Call your local emergency services (911 in [place] Do not drive yourself to the hospital. °MAKE SURE YOU:  °· Understand these instructions. °· Will watch your condition. °· Will get help right away if you are not doing well or get worse. °Document Released: 03/19/2005 Document Revised: 06/14/2013 Document Reviewed: 01/13/2008 °[employer]® Patient Information ©2015 [employer]. This information is not intended to replace advice given to you by your health care provider. Make sure you discuss any questions you have with your health care provider. ° °

## 2014-07-07 ENCOUNTER — Other Ambulatory Visit: Payer: Self-pay | Admitting: Urology

## 2014-07-07 DIAGNOSIS — R911 Solitary pulmonary nodule: Secondary | ICD-10-CM

## 2014-07-27 ENCOUNTER — Ambulatory Visit (HOSPITAL_COMMUNITY): Admission: RE | Admit: 2014-07-27 | Payer: BLUE CROSS/BLUE SHIELD | Source: Ambulatory Visit

## 2014-08-04 ENCOUNTER — Ambulatory Visit: Payer: BLUE CROSS/BLUE SHIELD | Admitting: Urology

## 2014-10-04 ENCOUNTER — Ambulatory Visit: Payer: BLUE CROSS/BLUE SHIELD | Admitting: Nurse Practitioner

## 2014-10-10 NOTE — Consult Note (Signed)
Large antral ulcer with visible vessel. Ulcer injected with epinephrine and then cauterized. Full liquid diet ordered.Moniter hgb. PPI BID x 8 wks. No ASA or NSAIDS.  Electronic Signatures: Lutricia Feilh, Lanaysia Fritchman (MD)  (Signed on 17-Oct-13 14:28)  Authored  Last Updated: 17-Oct-13 14:28 by Lutricia Feilh, Zendayah Hardgrave (MD)

## 2014-10-10 NOTE — Consult Note (Signed)
PATIENT NAME:  Laura Haynes, Alonna MR#:  [id number] DATE OF BIRTH:  [birth year]  DATE OF CONSULTATION:  04/07/2012  REFERRING PHYSICIAN:   CONSULTING PHYSICIAN:  Ezzard StandingPaul Y. Bluford Kaufmannh, MD  REASON FOR REFERRAL: Hematemesis.   HISTORY OF PRESENT ILLNESS: Patient is a 44 year old white female who has had history of sarcoidosis since age 44. Apparently sarcoidosis at least affects her skin and eyes. She has been on prednisone long term. Anyway, she has been experiencing increasing reflux type of symptoms as well as increasing dysphagia mainly to solids for the past month and a half. Recently she has been experiencing more chest pain and epigastric pain associated with some nausea. She had some bouts of gross hematemesis for the past two days. She also admits to taking aspirin at least twice a day for the last several weeks because of the chronic pain syndrome. As a result patient was brought in for evaluation.   PAST MEDICAL HISTORY:  1. History of sarcoidosis affecting her skin and eyes and lungs.  2. History of panic attacks.   MEDICATIONS:  1. Inhalers. 2. Fentanyl patch and Percocet for pain control.  3. Prednisone. 4. Prilosec. 5. Xanax.   PAST SURGICAL HISTORY:  1. Hysterectomy.  2. C-section.  3. Tonsillectomy.  4. Spine fusion.  5. Lung biopsy.   DRUG ALLERGIES: Morphine, Motrin, Darvocet and Coreg.    FAMILY HISTORY: Diabetes, heart attack, lung cancer.   SOCIAL HISTORY: She smokes at least four cigarettes a day. She does not drink any alcohol.   REVIEW OF SYSTEMS: There is no fever but she does feel chills. She does complain of fatigue and weakness as well as pain in her abdomen and chest. There is no shortness of breath or coughing or palpitation. Rest of the review of symptoms are negative.   PHYSICAL EXAMINATION:  GENERAL: Patient is in no acute distress.   VITAL SIGNS: She was afebrile. Vital signs were actually stable.   HEENT: Normocephalic, atraumatic head. Pupils are equally  reactive. Throat was clear.   NECK: Supple.   CARDIAC: Regular rhythm and rate without murmurs.   LUNGS: Lungs showed some rhonchi mostly on the right side.   ABDOMEN: Normoactive bowel sounds, soft. There was mild tenderness in the epigastric area. There is no hepatomegaly. There are no palpable masses.   EXTREMITIES: No clubbing, cyanosis, edema.   SKIN: Examination is essentially negative.   NEUROLOGICAL: Examination is nonfocal.   LABORATORY, DIAGNOSTIC AND RADIOLOGICAL DATA: Labs showed liver enzymes to be normal. White count was elevated at 19.3, hemoglobin 11.7, platelet count 448, glucose 110, BUN 23. Chest x-ray showed nodular opacity in the right lung base. CT of the abdomen showed abnormal area of density in the right lower lobe.   IMPRESSION: This is a patient with increasing reflux and dysphagia associated with hematemesis. She has also been taking aspirin twice a day. She also has abnormal CT scan. It is unclear whether this CT finding is related to sarcoidosis or whether she has gotten infection/abscess or even malignancy.   PLAN: We plan on endoscoping her tomorrow afternoon to evaluate the esophagus and her stomach for any ulcerations or inflammation. If stricture is present dilation will be performed. In the meantime, patient will need to be placed on proton pump inhibitor on a daily basis. She will also need further pulmonary work-up. Thank you for the referral.   ____________________________ Ezzard StandingPaul Y. Bluford Kaufmannh, MD pyo:cms D: 04/08/2012 08:17:00 ET T: 04/08/2012 10:28:51 ET JOB#: [id number]  cc: Ezzard StandingPaul Y. Bluford Kaufmannh, MD, <  Dictator> Ezzard Standing Quincey Nored MD ELECTRONICALLY SIGNED 04/09/2012 10:12

## 2014-10-10 NOTE — H&P (Signed)
PATIENT NAME:  Laura Haynes, Laura Haynes MR#:  [id number] DATE OF BIRTH:  October 09, 1970  DATE OF ADMISSION:  04/07/2012  PRIMARY CARE PHYSICIAN: Erskine Speed, MD (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 According to the patient, she had an episode of nausea and vomiting approximately a week ago when she actually had red blood in her vomitus, but that bleeding stopped and was just one time episode so she did not pay much attention and she kept doing whatever she does. Now yesterday, in the morning, she started having significant pains in the chest as well as her abdomen. Chest pain is described as stabbing pain, intermittent, increasing whenever she tries to eat, especially whenever she swallows where it feels like food is sticking in the chest. It improves whenever she takes oxycodone. In regards to the patient's abdominal pain, she tells me that it is in the upper abdomen, feels tender in the abdomen. She has been having this pain for a long period of time. However, it increased over the past 2 or 3 days now. The patient's pain is intermittent and decreases whenever she lies still. No radiation. Food gets stuck in the chest and the patient would improve that pain by throwing up. She decided to come to the Emergency Room for further evaluation. She brought some bright red blood to show the emergency room physician's. In the Emergency Room, she is hypotensive with systolic blood pressure in the 90s. She was given IV fluids with no significant improvement of her blood pressure yet. She was also noted to be anemic, however, hemoglobin level is 11.7. She feels somewhat dehydrated. Hospitalist services were contacted for  admission.   PAST MEDICAL HISTORY:  1. 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  2. Gastroesophageal reflux disease.  3. Chronic pain syndrome.  4. Panic attacks.  MEDICATIONS:  1. Albuterol 3 mL every six hours as needed.  2. Albuterol CFC 2 puffs four times daily as needed.  3. Fentanyl 75 mcg transdermal film every 48 hours.  4. Percocet 7.5/325 mg one tablet three times daily as needed.  5. Prednisone 10 mg p.o. daily.  6. Prilosec 40 mg p.o. twice daily.  7. Promethazine 25 mg every six hours as needed for nausea and vomiting. 8. Xanax 1 mg three times daily.   PAST SURGICAL HISTORY:  1. Hysterectomy. 2. Cesarean section. 3. Lower spine fusion. 4. Tonsillectomy. 5. Abscess drainage in the throat. 6. Lung biopsy in 1993 and another lung biopsy later on.  DRUG ALLERGIES: Morphine gives her blisters as well as shortness of breath, phenobarbital, Motrin, Darvocet, and Coreg.   FAMILY HISTORY: Diabetes mellitus in the patient's brother as well as sister. The patient's mother had a myocardial infarction in her 51s before menopause. She also had fibromyalgia. The patient's father died of lung cancer. He was a smoker. No other problems in the family.   SOCIAL HISTORY: The patient is married and has one child who is 59 years old, a son. Has smoked approximately four cigarettes a day since age 83. She is on disability. She does not drink any alcohol.   REVIEW OF SYSTEMS: Positive for feeling chilly which started approximately two days ago, sick and chilly over  the past 2 to 3 days, fatigue and weak, pains in her abdomen and chest, some difficulty swallowing and feels like food stops in the middle of her chest, wheezing intermittently in her lungs, has three pillow orthopnea and last night had some palpitations in the chest, also felt presyncopal, very sweaty yesterday, also dyspnea on exertion, intermittent nausea and vomiting, one episode of hematemesis  a week ago and now numerous episodes over the past two days, also abdominal pain in the upper abdomen, and had sarcoid outbreak not so long ago. CONSTITUTIONAL: Otherwise, she denies any fevers, weight loss or gain. EYES: In regards to eyes, denies any blurry vision, double vision, glaucoma, or cataracts. ENT: Denies any tinnitus, allergies, epistaxis, sinus pain, dentures, or difficulty swallowing, except as mentioned above. RESPIRATORY: Denies any cough, hemoptysis, asthma, or chronic obstructive pulmonary disease. CARDIOVASCULAR: Denies any chest pain, edema, or arrhythmias. GASTROINTESTINAL: Denies any diarrhea, melena, rectal bleeding, or change in bowel habits. GENITOURINARY: Denies dysuria, hematuria, frequency, or incontinence. ENDOCRINE: Denies any polydipsia, nocturia, thyroid problems, heat or cold intolerance or thirst. HEMATOLOGIC: Denies anemia, easy bruising, bleeding, or swollen glands. SKIN: Denies any acne, rashes, lesions, or change in moles. MUSCULOSKELETAL: Denies arthritis, cramps, or swelling. NEUROLOGIC: No numbness, epilepsy, or tremor. PSYCH: Denies anxiety, insomnia, or depression.   PHYSICAL EXAMINATION:   VITAL SIGNS: On arrival to the hospital, the patient's temperature is 98.9, pulse 111, respirations 22, blood pressure 109/64, and saturation 94% on room air.   GENERAL:  A well-developed, well-nourished, mildly obese Caucasian female, in mild distress, somewhat uncomfortable on the stretcher.   HEENT: Pupils are equally round and reactive to light. Extraocular movements are intact. No icterus or conjunctivitis. Has normal hearing. No pharyngeal erythema. Mucosa is moist.  NECK: No masses, supple and nontender. Thyroid is not enlarged. No adenopathy. No JVD or carotid bruits bilaterally. Full range of motion.   LUNGS: Clear to auscultation anteriorly, however, the patient does have rhonchi as well as a few rales posteriorly. She does not have any wheezing, but prolonged  expiratory phase. She does not have labored inspirations or increased effort. No dullness to percussion. Not in overt respiratory distress.   CARDIOVASCULAR: S1 and S2 appreciated. No murmurs, rubs or gallops noted. PMI not lateralized. Chest is nontender to palpation. +1 pedal pulses. No lower extremity edema, calf tenderness, or cyanosis.   ABDOMEN: Soft, minimally tender in the periumbilical area. Not much tenderness in the upper abdomen overall. No rebound or guarding was guarding noted. No hepatosplenomegaly or masses were noted.   RECTAL: Deferred.   MUSCULOSKELETAL: Able to move all extremities. No cyanosis, degenerative joint disease, or kyphosis. Gait was not tested.   SKIN: No rashes, lesions, erythema, nodularity, or induration. It was warm and dry to palpation.   LYMPH: No adenopathy in the cervical region.   NEUROLOGICAL: Cranial nerves grossly intact. Sensory is intact. No dysarthria or aphasia.   PSYCH: The patient is alert and oriented to time, person, and place, cooperative, somewhat anxious. Memory is somewhat impaired. No significant confusion, agitation, or depression noted.  LABS/RADIOLOGIC STUDIES: BMP showed glucose of 110 and BUN of 23, otherwise unremarkable. The patient's liver enzymes were normal. Blood cell count is normal at 19.3, hemoglobin 11.7, platelet count 448, and absolute neutrophil count is elevated to 15.8. Coagulation panel was unremarkable.   Chest x-ray, PA and lateral, on 04/07/2012, revealed small nodular opacity in the right lung base with possible central lucency, although this may be projectional. Necrotizing pneumonia or cavitary  lesion could have similar appearance. Further evaluation with CT of chest was recommended.   ASSESSMENT AND PLAN:  1. Hematemesis. Admit the patient to the medical floor. Follow her hemoglobin every 6 to 8 hours. Get gastroenterologist involved. Start the patient on PPI IV drip. Continue the patient on IV fluids as well  as n.p.o.  2. Posthemorrhagic anemia, suspected GI blood loss. As above, we will continue watching the patient's hemoglobin levels every six hours and we will transfuse as necessary. This was discussed with the patient. Transfusion risks as well as benefits were discussed with the patient and she voiced agreement.  3. Elevated white blood cell count of unclear etiology at this time, questionable pneumonia related. We will get a CT scan of the patient's chest and we will start the patient on Zosyn after blood cultures are taken.  4. Questionable pneumonia. As above, we will get CT scan of the chest and continue the patient on Zosyn as well as add Levaquin. Follow CT of chest results.  5. History of sarcoidosis. Continue the patient on DuoNebs. Hold prednisone due to gastrointestinal bleed.        6. Chronic pain syndrome. Continue the patient on fentanyl as well as Percocet.  7. Nausea. Continue the patient on antinausea medications.   TIME SPENT: One hour.  ____________________________ Katharina Caperima Mairead Schwarzkopf, MD rv:slb D: 04/07/2012 15:45:47 ET T: 04/07/2012 16:40:58 ET JOB#: [id number]  cc: Katharina Caperima Dela Sweeny, MD, <Dictator> Erskine SpeedBarbara Keith, MD ([facility]) Seira Cody MD ELECTRONICALLY SIGNED 05/07/2012 12:23

## 2014-10-10 NOTE — Consult Note (Signed)
Chief Complaint:   Subjective/Chief Complaint Pt feels fine. No abd pain. Tolerating solids. Had a normal appearing BM this AM.   VITAL SIGNS/ANCILLARY NOTES: **Vital Signs.:   19-Oct-13 08:19   Vital Signs Type Telemetry   Pulse Pulse 58   Pulse source if not from Vital Sign Device per Telemetry Clerk   Telemetry pattern Cardiac Rhythm pattern reported by Telemetry Clerk; Sinus-Bradycardia   Brief Assessment:   Cardiac Regular    Respiratory normal resp effort   Lab Results: Hepatic:  17-Oct-13 05:30    Bilirubin, Total 0.2   Alkaline Phosphatase 77   SGPT (ALT)  10   SGOT (AST)  10   Total Protein, Serum  5.4   Albumin, Serum  3.0  Routine Chem:  17-Oct-13 05:30    Glucose, Serum  128   BUN 12   Creatinine (comp)  0.58   Sodium, Serum 144   Potassium, Serum 3.7   Chloride, Serum  112   CO2, Serum 24   Calcium (Total), Serum  8.0   Osmolality (calc) 288   eGFR (African American) >60   eGFR (Non-African American) >60 (eGFR values <23m/min/1.73 m2 may be an indication of chronic kidney disease (CKD). Calculated eGFR is useful in patients with stable renal function. The eGFR calculation will not be reliable in acutely ill patients when serum creatinine is changing rapidly. It is not useful in  patients on dialysis. The eGFR calculation may not be applicable to patients at the low and high extremes of body sizes, pregnant women, and vegetarians.)   Anion Gap 8  Routine Hem:  17-Oct-13 05:30    WBC (CBC)  13.1   RBC (CBC)  3.05   Hemoglobin (CBC)  8.7   Hematocrit (CBC)  27.0   Platelet Count (CBC) 372   MCV 89   MCH 28.6   MCHC 32.3   RDW  15.2   Neutrophil % 79.3   Lymphocyte % 16.8   Monocyte % 3.5   Eosinophil % 0.0   Basophil % 0.4   Neutrophil #  10.4   Lymphocyte # 2.2   Monocyte # 0.5   Eosinophil # 0.0   Basophil # 0.1 (Result(s) reported on 08 Apr 2012 at 05:54AM.)   Assessment/Plan:  Assessment/Plan:   Assessment Gastric ulcer. Stable.     Plan Stable for discharge today. Discharge on protonix bid or any PPI bid x 8wks. Avoid ASA/NSAIDS. May benefit from Fe supplements. Have patient f/u with [facility] 1 month at my office. Consider repeating EGD in 2 months to assess for complete  healing.  Will sign off. Thanks.   Electronic Signatures: OVerdie Shire(MD)  (Signed 19-Oct-13 10:02)  Authored: Chief Complaint, VITAL SIGNS/ANCILLARY NOTES, Brief Assessment, Lab Results, Assessment/Plan   Last Updated: 19-Oct-13 10:02 by OVerdie Shire(MD)

## 2014-10-10 NOTE — Consult Note (Signed)
Full consult to follow. Hematemesis x 2 with nausea, epig pain, and dysphagia. ASA bid recently. No SOB or CP. Abn CT of chest. Hx of sarcoidosis, which may or may not be related to her CT findings. Anyway, plan EGD with poss dilation tomorrow afternoon. Full liquid diet but NPO after MN.  Electronic Signatures: Lutricia Feilh, Cher Egnor (MD)  (Signed on 16-Oct-13 16:53)  Authored  Last Updated: 16-Oct-13 16:53 by Lutricia Feilh, Malachi Kinzler (MD)

## 2014-10-10 NOTE — Discharge Summary (Signed)
PATIENT NAME:  Laura Haynes, Laura Haynes MR#:  [id number] DATE OF BIRTH:  10/29/1970  DATE OF ADMISSION:  04/07/2012 DATE OF DISCHARGE:  04/10/2012  PRIMARY CARE PHYSICIAN: None local.  CONSULTANTS:  1. Lutricia FeilPaul Oh, MD - Gastroenterology. 2. Erin FullingKurian Kasa, MD - Pulmonary.  DISCHARGE DIAGNOSES: 1. Gastrointestinal bleeding. 2. Peptic ulcer disease. 3. Anemia. 4. Pneumonia. 5. Hypotension. 6. Leukocytosis.  7. Sarcoidosis.  PROCEDURE: Esophagogastroduodenoscopy. Significant findings: Peptic ulcer disease.  CONDITION: Stable.   CODE STATUS: FULL CODE.   DISCHARGE/HOME MEDICATIONS:  1. Albuterol 2.5 mg/3 mL inhalation 3 mL inhaled every six hours p.r.n. for shortness of breath.  2. Albuterol CFC free 90 mcg inhalation two puffs four times daily p.r.n. for shortness of breath.  3. Xanax 1 mg p.o. three times daily. 4. Fentanyl patch 75 mcg/h transdermal one patch transdermal every 48 hours.  5. Percocet 7.5/325 mg p.o. tablets one tablet three times daily p.r.n.  6. Promethazine 25 mg p.o. every six hours p.r.n. for nausea and vomiting.  7. Hydroxychloroquine 200 mg p.o. daily.  8. Pantoprazole 40 mg p.o. twice a day. 9. Nicotine patch 14 mg/24 hour transdermal film one patch transdermal once a day.  10. Prednisone 5 mg p.o. daily for seven days.  11. Levaquin 500 mg p.o. daily for seven days.  12. Feosol 325 mg p.o. three times daily;Marland Kitchen.  MEDICATIONS TO STOP: Prilosec 40 mg p.o. twice a day.   DIET: Regular diet.   ACTIVITY: As tolerated.   FOLLOW-UP CARE: Follow-up with PCP within 1 to 2 weeks. Follow-up with Dr. Bluford Kaufmannh within 1 to 2 weeks   REASON FOR ADMISSION: Abdominal pain as well as chest discomfort and pain.   HOSPITAL COURSE: The patient is a 44 year old Caucasian female with a history of sarcoidosis, GERD, and chronic pain syndrome who presented to the ED with abdominal pain, chest discomfort, and chest pain. In addition, the patient had one episode of nausea and vomiting about one  week ago with red blood in her vomitus. For detailed history and physical examination, please refer to the admission note dictated by Dr. Katharina Caperima Vaickute. On admission date, the patient's chest x-ray showed small nodular opacity on the right lung base. Laboratory data showed WBC of 19.3, hemoglobin 11.7, and platelets 448. BUN was 23. The patient was admitted for hematemesis and GI bleeding. After admission the patient has been treated with a proton pump inhibitor, Protonix drip, and IV fluids and was n.p.o. Dr. Bluford Kaufmannh evaluated the patient and did an EGD which showed a large antral ulcer with visible vessel which was injected with epinephrine and then cauterized. Dr. Bluford Kaufmannh suggested a full liquid diet and monitor hemoglobin with PPI twice a day for eight weeks and no aspirin and no NSAIDs. In addition, he [facility]. The patient's hemoglobin on admission was 11.7 and then gradually decreased to 8.7 then 8.1, but according to Dr. Bluford Kaufmannh, The patient does not need PRBC transfusion and recommended Feosol and followup with him as an outpatient.   For pneumonia, the patient's chest x-ray and CAT scan of chest showed an abnormal area of density in the right lower lobe possibly due to acute infectious process, underlying malignancy or benign enhancing vascular lesions. So we treated the patient with Levaquin for possible pneumonia. The patient's white count decreased from 19.3 to 12.0.  For sarcoidosis, Dr. Erin FullingKurian Kasa evaluated the patient and suggested prednisone 5 mg p.o. for seven days. In addition, he suggested Flagyl 200 mg p.o. daily.  After the above-mentioned treatment, the patient has no complaints, no symptoms, and vital signs are stable. She is clinically stable and will be discharged to home today. I discussed the patient's situation and discharge plan with the patient and the patient's husband, nurse, and Dr. Bluford Kaufmann.   TIME SPENT: About 36 minutes.   ____________________________ Shaune Pollack, MD qc:slb D: 04/10/2012 15:05:22 ET T: 04/12/2012 10:00:51 ET JOB#: [id number]  cc: Shaune Pollack, MD, <Dictator> Shaune Pollack MD ELECTRONICALLY SIGNED 04/14/2012 20:09

## 2014-10-10 NOTE — Consult Note (Signed)
Chief Complaint:   Subjective/Chief Complaint Feels much better. No abd pain. No more bleeding. Hungry.   VITAL SIGNS/ANCILLARY NOTES: **Vital Signs.:   18-Oct-13 12:48   Telemetry pattern Cardiac Rhythm Normal sinus rhythm; pattern reported by Telemetry Clerk; HR 85   Brief Assessment:   Cardiac Regular    Respiratory clear BS    Gastrointestinal Normal   Lab Results: Hepatic:  17-Oct-13 05:30    Bilirubin, Total 0.2   Alkaline Phosphatase 77   SGPT (ALT)  10   SGOT (AST)  10   Total Protein, Serum  5.4   Albumin, Serum  3.0  Routine Chem:  17-Oct-13 05:30    Glucose, Serum  128   BUN 12   Creatinine (comp)  0.58   Sodium, Serum 144   Potassium, Serum 3.7   Chloride, Serum  112   CO2, Serum 24   Calcium (Total), Serum  8.0   Osmolality (calc) 288   eGFR (African American) >60   eGFR (Non-African American) >60 (eGFR values <51m/min/1.73 m2 may be an indication of chronic kidney disease (CKD). Calculated eGFR is useful in patients with stable renal function. The eGFR calculation will not be reliable in acutely ill patients when serum creatinine is changing rapidly. It is not useful in  patients on dialysis. The eGFR calculation may not be applicable to patients at the low and high extremes of body sizes, pregnant women, and vegetarians.)   Anion Gap 8  Routine Hem:  17-Oct-13 05:30    WBC (CBC)  13.1   RBC (CBC)  3.05   Hemoglobin (CBC)  8.7   Hematocrit (CBC)  27.0   Platelet Count (CBC) 372   MCV 89   MCH 28.6   MCHC 32.3   RDW  15.2   Neutrophil % 79.3   Lymphocyte % 16.8   Monocyte % 3.5   Eosinophil % 0.0   Basophil % 0.4   Neutrophil #  10.4   Lymphocyte # 2.2   Monocyte # 0.5   Eosinophil # 0.0   Basophil # 0.1 (Result(s) reported on 08 Apr 2012 at 05:54AM.)  18-Oct-13 04:20    WBC (CBC)  13.4   RBC (CBC)  3.12   Hemoglobin (CBC)  9.2   Hematocrit (CBC)  27.9   Platelet Count (CBC) 335   MCV 89   MCH 29.5   MCHC 33.0   RDW  15.1    Neutrophil % 82.9   Lymphocyte % 14.3   Monocyte % 2.4   Eosinophil % 0.0   Basophil % 0.4   Neutrophil #  11.1   Lymphocyte # 1.9   Monocyte # 0.3   Eosinophil # 0.0   Basophil # 0.1 (Result(s) reported on 09 Apr 2012 at 04:51AM.)   Assessment/Plan:  Assessment/Plan:   Assessment Duodenal ulcer.    Plan Low residue diet. If tolerated, poss discharge tonight or tomorrow AM on protonix bid x 8 wks and no ASA/NSAIDS.   Electronic Signatures: OVerdie Shire(MD)  (Signed 18-Oct-13 13:55)  Authored: Chief Complaint, VITAL SIGNS/ANCILLARY NOTES, Brief Assessment, Lab Results, Assessment/Plan   Last Updated: 18-Oct-13 13:55 by OVerdie Shire(MD)

## 2014-10-13 ENCOUNTER — Ambulatory Visit (INDEPENDENT_AMBULATORY_CARE_PROVIDER_SITE_OTHER): Payer: BLUE CROSS/BLUE SHIELD | Admitting: Urology

## 2014-10-13 ENCOUNTER — Other Ambulatory Visit: Payer: Self-pay | Admitting: Urology

## 2014-10-13 DIAGNOSIS — N1330 Unspecified hydronephrosis: Secondary | ICD-10-CM

## 2014-10-13 DIAGNOSIS — R312 Other microscopic hematuria: Secondary | ICD-10-CM

## 2014-10-13 NOTE — H&P (Signed)
PATIENT NAME:  Laura Haynes, Laura Haynes MR#:  [id number] DATE OF BIRTH:  Dec 20, 1970  DATE OF ADMISSION:  06/16/2013  REFERRING PHYSICIAN:  Dr. Jene Every.   PRIMARY CARE PHYSICIAN: .  HISTORY OF PRESENT ILLNESS:  This is a very nice 44 year old female with history of sarcoidosis that involves her lungs, skin, eyes, nose, mouth, and perineal area. The patient comes today with a history of starting to get sick 2 to 3 days ago with increased shortness of breath. The patient has chronic respiratory failure. She requires 2 L of oxygen at home, but within the past 2 days, she has been requiring up to 3 to 4. The patient has significant dyspnea with any activity and started coughing with production of increased> sputum for the past 2 days. The patient states that she has been coughing all day, and it makes her chest sore at the level of the sternal and parasternal areas. She denies any fever. She states that she has been having significant chills, but has not felt feverish, and she is starting to sweat very frequently, especially at night. The patient is admitted for evaluation of this condition and treatment of sarcoidosis flare and possible superimposed lower respiratory infection, like bronchitis.   REVIEW OF SYSTEMS:  CONSTITUTIONAL:  No fever. Positive chills, positive fatigue.  EYES:  No blurred vision or double vision.  ENT:  No difficulty swallowing, no epistaxis.  RESPIRATORY:  Positive cough, positive wheezing, positive dyspnea, positive sarcoidosis.  CARDIOVASCULAR:  No chest pain or orthopnea.  GASTROINTESTINAL:  No nausea or vomiting. No constipation, although the patient states that she does not go to the bathroom every day on a regular basis, only every other day or every 2 days.  GENITOURINARY:  No dysuria, hematuria, or changes in frequency.  GYNECOLOGY:  No breast masses.  ENDOCRINOLOGY:  No polyuria, polydipsia, or polyphagia.  HEMATOLOGIC AND LYMPHATIC:  No anemia, easy bruising, or swollen  glands.  MUSCULOSKELETAL:  No significant neck pain, back pain.  NEUROLOGIC:  No numbness or tingling, CVAs or TIAs.  PSYCHIATRIC:  No significant depression, but the patient has generalized anxiety disorder.   PAST MEDICAL HISTORY:  1.  Sarcoidosis affecting the lungs, the skin, the eyes, the nose, the mouth, perineal area diagnosed in 1993.  2.  Severe GERD.  3.  Generalized anxiety disorder with panic attacks.  4.  Chronic pain syndrome.  5.  Steroid dependent.   CURRENT MEDICATIONS:  Include: OxyContin 325/7.5 mg as needed for pain, albuterol and ipratropium nebs as needed for shortness of breath, alprazolam 1 mg 3 times a day as needed for anxiety, clobetasol skin cream, Dexilant 60 mg once a day, fentanyl 75 mcg transdermal every 48 hours, Mucinex 600 mg every 12 hours, prednisone 10 mg once a day, Proventil as needed for shortness of breath.   ALLERGIES:  COREG, MORPHINE, MOTRIN MIGRAINE PAIN, and PHENOBARBITAL.   PAST SURGICAL HISTORY:  1.  Lung biopsy in 1993. 2.  Drainage of abscess from throat.  3.  Tonsillectomy.  4.  Hysterectomy.  5.  C-section.  6.  Lower spine effusion.   FAMILY HISTORY:  Positive for diabetes in her brother, MI in her mother before 57, and her dad died from lung cancer.   SOCIAL HISTORY:  The patient is married, lives with her husband. Her son is at their house, and his wife is pregnant, and they are expecting a baby within the next couple of days. The patient used to smoke. She smoked 3 to 4 cigarettes  a day since the age of 44, and now she quit 6 months ago. She does not drink alcohol.   PHYSICAL EXAMINATION:  VITAL SIGNS:  Blood pressure 97/46, pulse 80, respirations 18, temperature 98.2.  GENERAL:  The patient is alert, oriented x3, in no acute distress. Mild respiratory distress with use of accessory muscles whenever she coughs or whenever she walks or whenever she gets up and moves to the commode.  HEENT:  Pupils are equal and reactive.  Extraocular movements are intact. Mucosa are moist. Anicteric sclerae. Pink conjunctivae. No oral lesions. No oropharyngeal exudates.  NECK:  Supple. No JVD. No thyromegaly. No adenopathy. No carotid bruits. No rigidity.  CARDIOVASCULAR:  Regular rate and rhythm. No murmurs, rubs, or gallops are appreciated. No displacement of PMI. Mild tenderness to palpation of parasternal areas due to the cough.  LUNGS:  Positive for rhonchi and wheezing distributed through all respiratory fields. Positive use of accessory muscles as mentioned above.  ABDOMEN:  Soft, nontender, nondistended. No hepatosplenomegaly. No masses. Bowel sounds are positive.  GENITALIA:  Exam is deferred.  EXTREMITIES:  No edema, cyanosis, or clubbing. Pulses +2. Capillary refill less than 3.  NEUROLOGIC:  Cranial nerves II through XII intact. Strength is 5/5 in all extremities.  PSYCHIATRIC:  No agitation or anxiety at this moment. The patient is very pleasant.  MUSCULOSKELETAL:  No joint effusions or joint swelling.  SKIN:  No rashes or petechiae.  HEMATOLOGIC AND LYMPHATIC:  No swollen glands and no hematomas.    RESULTS:  Chest x-ray: No infiltrates, changes secondary to COPD are stable. Glucose 107, creatinine 0.53. Electrolytes within normal limits with a 4.1 potassium. LFTs within normal limits. Troponin 0.02. White count is 16,000, and the hemoglobin is 13.9 with platelets 389.   ASSESSMENT AND PLAN:  This is a very nice 44 year old female with history of sarcoidosis, generalized anxiety disorder, severe gastroesophageal reflux disease, who comes with shortness of breath and a sarcoid exacerbation with acute bronchitis.  1.  Acute on chronic respiratory failure. The patient has 2 L of oxygen at home. She has been requiring 3 to 4. Her oxygen saturation on 2 L was 89%, for which needed to be increased.  2.  The patient has significant wheezing and increased secretions, for which we are treating for chronic obstructive pulmonary  disease/sarcoidosis exacerbation with other infection/acute bronchitis. The patient is going to be on Levaquin and steroids at 40 mg every 6 hours of Solu-Medrol.  3.  Continue pulmonary toilet and continue albuterol/Atrovent nebulizers.  4.  The patient seems to be stable at this moment, and there are no other manifestations of sarcoid, no neurologic changes, etc.  5.  The patient has an elevation in the white count at 16,000, tachycardic up to 97, with a respiratory rate of 26, which may meet criteria for systemic inflammatory response syndrome.  6.  Systemic inflammatory response syndrome, likely due to acute bronchitis and exacerbation of sarcoidosis. Blood cultures have been sent.  7.  No signs of consolidation on repeated x-ray, for which it is unlikely the patient had pneumonia, but definitely she has at least acute bronchitis.  8.  Deep vein thrombosis prophylaxis with heparin.  9.  Gastrointestinal prophylaxis with Protonix. The patient takes Dexilant at home. If she is able to bring her own medication, she can continue it over here. If not, we will do double dose Protonix.  10.  Chronic pain syndrome. Continue fentanyl and continue acetaminophen with OxyContin.   TIME SPENT:  I spent about 45 minutes with this patient.    ____________________________ Felipa Furnace, MD rsg:ms D: 06/16/2013 19:12:07 ET T: 06/16/2013 19:49:30 ET JOB#: [id number]  cc: Felipa Furnace, MD, <Dictator> Jevante Hollibaugh Juanda Chance MD ELECTRONICALLY SIGNED 06/20/2013 0:40

## 2014-10-14 NOTE — Discharge Summary (Signed)
PATIENT NAME:  Laura Haynes, Laura Haynes MR#:  [id number] DATE OF BIRTH:  Oct 26, 1970  DATE OF ADMISSION:  06/16/2013 DATE OF DISCHARGE:  06/17/2013  This delayed discharge summary as I was not aware of the patient leaving after being admitted.   The patient is 44 year old female with history of sarcoidosis involving the lungs, the skin, the eyes, the nose, the mouth,  the perineal area. She was admitted on 06/15/2013 with a history of increased shortness of breath. The patient was admitted as acute on chronic respiratory failure. She has 2 liters of oxygen at home. She was requiring 3 to 4 for this hospitalization. Her oxygen saturation was 89% on 2 liters for what it needed to be increased. This patient had significant wheezing and increased secretions. She was started on pulmonary toilet, steroids and Levaquin. Solu-Medrol was given IV every six hours and albuterol Atrovent nebulizers were put on as well. The patient had no other manifestations of sarcoid at the moment. Her white count was elevated at 16,000. She was tachycardic at 97 with a respiratory rate of 26, which met criteria for systemic inflammatory response syndrome. At that moment the focus of possible infection with the lung, although there was not any signs of pneumonia or consolidation. The patient was admitted to the floor. She decided that she wanted to leave [place]. She did not want to take any prescriptions home or any other help. She felt good enough to be at home. She was recommended to stay and not to go away, but she did not accept that and after  she left she was told that if she needed to come back that she soukd   come to the Emergency Department again for any needs.   ____________________________ [place] Sink, MD rsg:sg D: 06/28/2013 07:07:00 ET T: 06/28/2013 07:47:22 ET JOB#: [id number]  cc: Stark Sink, MD, <Dictator> Lola Lofaro America Brown MD ELECTRONICALLY SIGNED 07/06/2013 23:10

## 2014-10-19 ENCOUNTER — Encounter (HOSPITAL_COMMUNITY): Payer: BLUE CROSS/BLUE SHIELD

## 2014-10-25 ENCOUNTER — Emergency Department (HOSPITAL_COMMUNITY)
Admission: EM | Admit: 2014-10-25 | Discharge: 2014-10-25 | Disposition: A | Discharge status: Home / Self Care | Payer: BLUE CROSS/BLUE SHIELD | Attending: Emergency Medicine | Admitting: Emergency Medicine

## 2014-10-25 ENCOUNTER — Emergency Department (HOSPITAL_COMMUNITY): Payer: BLUE CROSS/BLUE SHIELD

## 2014-10-25 ENCOUNTER — Encounter (HOSPITAL_COMMUNITY): Payer: Self-pay | Admitting: Emergency Medicine

## 2014-10-25 DIAGNOSIS — Z8719 Personal history of other diseases of the digestive system: Secondary | ICD-10-CM | POA: Diagnosis not present

## 2014-10-25 DIAGNOSIS — R05 Cough: Secondary | ICD-10-CM

## 2014-10-25 DIAGNOSIS — Z7952 Long term (current) use of systemic steroids: Secondary | ICD-10-CM | POA: Diagnosis not present

## 2014-10-25 DIAGNOSIS — J449 Chronic obstructive pulmonary disease, unspecified: Secondary | ICD-10-CM | POA: Insufficient documentation

## 2014-10-25 DIAGNOSIS — Z9981 Dependence on supplemental oxygen: Secondary | ICD-10-CM | POA: Diagnosis not present

## 2014-10-25 DIAGNOSIS — D869 Sarcoidosis, unspecified: Secondary | ICD-10-CM | POA: Diagnosis not present

## 2014-10-25 DIAGNOSIS — F419 Anxiety disorder, unspecified: Secondary | ICD-10-CM | POA: Insufficient documentation

## 2014-10-25 DIAGNOSIS — Z792 Long term (current) use of antibiotics: Secondary | ICD-10-CM | POA: Diagnosis not present

## 2014-10-25 DIAGNOSIS — R059 Cough, unspecified: Secondary | ICD-10-CM

## 2014-10-25 DIAGNOSIS — Z720 Tobacco use: Secondary | ICD-10-CM | POA: Diagnosis not present

## 2014-10-25 DIAGNOSIS — D72829 Elevated white blood cell count, unspecified: Secondary | ICD-10-CM

## 2014-10-25 DIAGNOSIS — Z79899 Other long term (current) drug therapy: Secondary | ICD-10-CM | POA: Insufficient documentation

## 2014-10-25 LAB — CBC
HCT: 43.3 % (ref 36.0–46.0)
Hemoglobin: 14 g/dL (ref 12.0–15.0)
MCH: 31.3 pg (ref 26.0–34.0)
MCHC: 32.3 g/dL (ref 30.0–36.0)
MCV: 96.7 fL (ref 78.0–100.0)
PLATELETS: 460 10*3/uL — AB (ref 150–400)
RBC: 4.48 MIL/uL (ref 3.87–5.11)
RDW: 13 % (ref 11.5–15.5)
WBC: 22.9 10*3/uL — ABNORMAL HIGH (ref 4.0–10.5)

## 2014-10-25 LAB — BASIC METABOLIC PANEL
Anion gap: 10 (ref 5–15)
BUN: 16 mg/dL (ref 6–20)
CALCIUM: 9.1 mg/dL (ref 8.9–10.3)
CO2: 25 mmol/L (ref 22–32)
Chloride: 105 mmol/L (ref 101–111)
Creatinine, Ser: 0.62 mg/dL (ref 0.44–1.00)
GLUCOSE: 91 mg/dL (ref 70–99)
Potassium: 4.4 mmol/L (ref 3.5–5.1)
SODIUM: 140 mmol/L (ref 135–145)

## 2014-10-25 MED ORDER — LEVOFLOXACIN 750 MG PO TABS
750.0000 mg | ORAL_TABLET | Freq: Every day | ORAL | Status: DC
Start: 2014-10-25 — End: 2015-03-29

## 2014-10-25 MED ORDER — CEFTRIAXONE SODIUM 1 G IJ SOLR
1.0000 g | Freq: Once | INTRAMUSCULAR | Status: AC
  Administered 2014-10-25: 1 g via INTRAMUSCULAR
  Filled 2014-10-25: qty 10

## 2014-10-25 MED ORDER — IOHEXOL 300 MG/ML  SOLN: 100.0000 mL | Freq: Once | INTRAMUSCULAR | Status: DC | PRN

## 2014-10-25 MED ORDER — LEVOFLOXACIN 750 MG PO TABS
750.0000 mg | ORAL_TABLET | Freq: Once | ORAL | Status: AC
  Administered 2014-10-25: 750 mg via ORAL
  Filled 2014-10-25: qty 1

## 2014-10-25 MED ORDER — PREDNISONE 10 MG PO TABS: 10.0000 mg | ORAL_TABLET | Freq: Every day | ORAL | Status: DC

## 2014-10-25 MED ORDER — LIDOCAINE HCL (PF) 1 % IJ SOLN
INTRAMUSCULAR | Status: AC
  Administered 2014-10-25: 2.1 mL
  Filled 2014-10-25: qty 5

## 2014-10-25 NOTE — ED Provider Notes (Signed)
CSN: [id number]     Arrival date & time 10/25/14  1257 History   First MD Initiated Contact with Patient 10/25/14 1324     Chief Complaint  Patient presents with  . Cough     Patient is a 44 y.o. female presenting with cough. The history is provided by the patient. No language interpreter was used.  Cough  Ms. Eddie CandleCummings presents for evaluation of cough and shortness of breath. She reports that she has had a productive cough and shortness of breath since February. She has been followed by her family doctor and has tried 2 rounds of antibiotics, amoxicillin and doxycycline without relief. She denies any fevers. She has chest soreness with breathing and coughing. She is productive of yellow and green sputum. She denies any lower extremity edema. She does have some nausea and malaise. She is using her inhalers at home without relief. She has a history of sarcoidosis and has been on prednisone for 20 years. Symptoms are moderate, constant, worsening.  Past Medical History  Diagnosis Date  . Sarcoidosis     chronic pain medication  . 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 COPD (chronic obstructive pulmonary disease)   . Asthma   . Anxiety   . Oxygen dependent     prn basis  . Shortness of breath   . Gastric ulcer with hemorrhage 2013    gastric ulcer surgery   Past Surgical History  Procedure Laterality Date  . Cesarean section  1993  . Lung biopsy  [id number]  . Tonsillectomy  2008  . Back surgery  2008  . Abdominal hysterectomy  2003  . Esophagogastroduodenoscopy  March 2014    Dr. Renae FicklePaul Oh: 1 crater gastric ulcer in the antrum. Esophagus normal. Biopsying benign, no H. pylori.  . Colonoscopy      age 44, [facility]. normal per patient  . Esophagogastroduodenoscopy  January 2014    Dr. Renae FicklePaul Oh: Gastric ulcer, normal esophagus, biopsies benign, no H. pylori  . Esophagogastroduodenoscopy  October 2013    Dr. Renae FicklePaul Oh: Large gastric ulcer with visible vessel  injected with epinephrine. No biopsy done.  . Esophagogastroduodenoscopy (egd) with esophageal dilation N/A 04/13/2013    RMR: Extensive ulceration/ scarring/deformity of the antral/prepyloric gastric mucosa as described above-status post bx  . Cataract extraction w/phaco Left 04/17/2014    Procedure: CATARACT EXTRACTION PHACO AND INTRAOCULAR LENS PLACEMENT LEFT EYE CDE=4.91;  Surgeon: Gemma PayorKerry Hunt, MD;  Location: [facility];  Service: Ophthalmology;  Laterality: Left;  . Cataract extraction w/phaco Right 05/25/2014    Procedure: CATARACT EXTRACTION PHACO AND INTRAOCULAR LENS PLACEMENT RIGHT EYE;  Surgeon: Gemma PayorKerry Hunt, MD;  Location: [facility];  Service: Ophthalmology;  Laterality: Right;  CDE:3.09   Family History  Problem Relation Age of Onset  . Colon cancer Maternal Aunt     greater than age 46  . Liver disease Neg Hx   . Breast cancer Mother   . Lung cancer Father    History  Substance Use Topics  . Smoking status: Smoker, Current Status Unknown -- 0.25 packs/day    Types: Cigarettes  . Smokeless tobacco: Not on file     Comment: 1/2 pack a day  . Alcohol Use: No   OB History    No data available     Review of Systems  Respiratory: Positive for cough.   All other systems reviewed and are negative.     Allergies  Coreg; Darvocet;  Morphine and related; Motrin; and Phenobarbital  Home Medications   Prior to Admission medications   Medication Sig Start Date End Date Taking? Authorizing Provider  albuterol (PROVENTIL HFA;VENTOLIN HFA) 108 (90 BASE) MCG/ACT inhaler Inhale 2 puffs into the lungs every 6 (six) hours as needed for wheezing or shortness of breath.    Historical Provider, MD  albuterol (PROVENTIL) (2.5 MG/3ML) 0.083% nebulizer solution Take 2.5 mg by nebulization every 6 (six) hours as needed for wheezing.    Historical Provider, MD  ALPRAZolam Prudy Feeler) 0.5 MG tablet Take 0.5 mg by mouth 3 (three) times daily as needed for anxiety.    Historical Provider, MD  BESIVANCE 0.6  % SUSP Place 1 drop into the right eye 2 (two) times daily. 05/23/14   Historical Provider, MD  dexlansoprazole (DEXILANT) 60 MG capsule Take 1 capsule (60 mg total) by mouth daily. 10/18/13   Tiffany Kocher, PA-C  doxycycline (VIBRAMYCIN) 100 MG capsule Take 1 capsule (100 mg total) by mouth 2 (two) times daily. 06/15/14   Glynn Octave, MD  fentaNYL (DURAGESIC - DOSED MCG/HR) 75 MCG/HR Place 50 mcg onto the skin every other day.     Historical Provider, MD  oxyCODONE-acetaminophen (PERCOCET) 7.5-325 MG per tablet Take 1 tablet by mouth 2 (two) times daily as needed for pain.    Historical Provider, MD  polyvinyl alcohol-povidone (HYPOTEARS) 1.4-0.6 % ophthalmic solution Place 1-2 drops into both eyes as needed. Dry eyes    Historical Provider, MD  predniSONE (DELTASONE) 10 MG tablet Take 10 mg by mouth daily.    Historical Provider, MD  promethazine (PHENERGAN) 25 MG tablet Take 25 mg by mouth every 4 (four) hours as needed. FOR NAUSEA AND VOMITING 06/08/14   Historical Provider, MD  SAPHRIS 5 MG SUBL 24 hr tablet Place 5 mg under the tongue 2 (two) times daily. 06/08/14   Historical Provider, MD  sucralfate (CARAFATE) 1 GM/10ML suspension Take 10 mLs (1 g total) by mouth 4 (four) times daily -  with meals and at bedtime. 10/18/13   Tiffany Kocher, PA-C   BP 120/68 mmHg  Pulse 90  Temp(Src) 98.8 F (37.1 C) (Oral)  Resp 16  Ht  (1.727 m)  Wt 115 lb (52.164 kg)  BMI 17.49 kg/m2  SpO2 96% Physical Exam  Constitutional: She is oriented to person, place, and time. She appears well-developed and well-nourished.  HENT:  Head: Normocephalic and atraumatic.  Cardiovascular: Normal rate and regular rhythm.   No murmur heard. Pulmonary/Chest: Effort normal. No respiratory distress.  Occasional rhonchi in the right upper lobe.  Abdominal: Soft. There is no tenderness. There is no rebound and no guarding.  Musculoskeletal: She exhibits no edema or tenderness.  Neurological: She is alert and  oriented to person, place, and time.  Skin: Skin is warm and dry.  Psychiatric: She has a normal mood and affect. Her behavior is normal.  Nursing note and vitals reviewed.   ED Course  Procedures (including critical care time) Labs Review Labs Reviewed  CBC - Abnormal; Notable for the following:    WBC 22.9 (*)    Platelets 460 (*)    All other components within normal limits  BASIC METABOLIC PANEL    Imaging Review Dg Chest 2 View (if Patient Has Fever And/or Copd)  10/25/2014   CLINICAL DATA:  Intermittent productive cough for 3 months. No response to 2 rounds of antibiotics. History of sarcoidosis. Initial encounter.  EXAM: CHEST  2 VIEW  COMPARISON:  Chest radiographs 06/14/2014 and 06/20/2013. Abdominal CT 05/23/2014 and 11/18/2013.  FINDINGS: The heart size and mediastinal contours are stable without evidence of adenopathy. Right lower lobe nodularity is grossly stable, as visualized on prior abdominal CTs. No superimposed airspace disease, edema or significant pleural effusion identified. The bones appear stable. There is a mild scoliosis.  IMPRESSION: 1. No acute cardiopulmonary process. 2. Grossly stable nodularity at the right lung base, potentially related to the patient's sarcoidosis. Again, full chest CT should be considered to better document stability and exclude neoplasm.   Electronically Signed   By: Carey BullocksWilliam  Veazey M.D.   On: 10/25/2014 13:46   Ct Chest Wo Contrast  10/25/2014   CLINICAL DATA:  History of sarcoidosis with cough for 3 months, abnormal chest x-ray  EXAM: CT CHEST WITHOUT CONTRAST  TECHNIQUE: Multidetector CT imaging of the chest was performed following the standard protocol without IV contrast.  COMPARISON:  11/18/2013  FINDINGS: The lungs are well aerated bilaterally. Some mild upper lobe fibrotic changes are noted likely related to the underlying given clinical history of sarcoidosis. In the right lower lobe there is a persistent nodular opacity which again  measures approximately 2.6 cm in greatest dimension. It is stable in appearance from the prior exam and has some small associated adjacent lesions identified inferiorly. These are stable in appearance from the prior exam. No new focal parenchymal nodule is seen. No focal infiltrate is noted.  The thoracic inlet is within normal limits. The hilar and mediastinal structures as visualized without contrast are within normal limits.  The upper abdomen is unremarkable. No acute bony abnormality is seen. A scoliosis of the thoracolumbar spine is again noted.  IMPRESSION: Stable changes in the right lower lobe of when compared with prior study from 11/18/2013. Given its stability it likely represents sequelae from the patient's known history of sarcoidosis although a followup CT examination in 12 months is recommended to assess for stability.  Mild upper lobe scarring similar to that seen in the lower lobe also likely related to the underlying history of sarcoidosis. This can also be followed with subsequent chest CT as described above.   Electronically Signed   By: Alcide CleverMark  Lukens M.D.   On: 10/25/2014 17:55     EKG Interpretation   Date/Time:  Wednesday Oct 25 2014 19:04:02 EDT Ventricular Rate:  72 PR Interval:  139 QRS Duration: 88 QT Interval:  426 QTC Calculation: 466 R Axis:   87 Text Interpretation:  Sinus arrhythmia Confirmed by Lincoln Brighamees, Liz [phone]([id number]) on  10/25/2014 7:08:46 PM      MDM   Final diagnoses:  Sarcoidosis  Leukocytosis    Patient with history of sarcoidosis here for productive cough, on antibiotics times the last several months without improvement in symptoms. Last course of antibiotics was completed in mid March. Patient has no respiratory distress in the emergency department with good air movement bilaterally. CBC demonstrates leukocytosis, patient has history of leukocytosis and is on chronic steroid therapy. Chest x-ray and CT scan without any evidence of acute pneumonia, sarcoidosis  appears to be stable radiographically. History and presentation is not consistent with PE or ACS. Patient is nontoxic appearing in the emergency department. Discussed with patient likely sarcoid flare, question some degree of subclinical pneumonia, starting on antibiotics and increasing her prednisone. Discussed close PCP follow-up as well as pulmonology follow-up and return precautions.    Tilden FossaElizabeth Xzaviar Maloof, MD 10/25/14 2120

## 2014-10-25 NOTE — ED Notes (Addendum)
Pt reports sent here from pcp office for continued pneumonia. Pt reports has been on "2 rounds of antibiotics since february" with no relief. Pt reports continued productive cough but denies any fevers.nad noted.

## 2014-10-25 NOTE — Discharge Instructions (Signed)
Get recheck immediately if you develop fevers, worsening breathing, or new concerning symptoms.  Be sure to drink plenty of fluids.    Sarcoidosis, Schaumann's Disease, Sarcoid of [place] Sarcoidosis appears briefly and heals naturally in 60 to 70 percent of cases, often without the patient knowing or doing anything about it. 20 to 30 percent of patients with sarcoidosis are left with some permanent lung damage. In 10 to 15 percent of the patients, sarcoidosis can become chronic (long lasting). When either the granulomas or fibrosis seriously affect the function of a vital organ (lungs, heart, nervous system, liver, or kidneys), sarcoidosis can be fatal. This occurs 5 to 10 percent of the time. No one can predict how sarcoidosis will progress in an individual patient. The symptoms the patient experiences, the caregiver's findings, and the patient's race can give some clues. Sarcoidosis was once considered a rare disease. We now know that it is a common chronic illness that appears all over the world. It is the most common of the fibrotic (scarring) lung disorders. Anyone can get sarcoidosis. It occurs in all races and in both sexes. The risk is greater if you are a young black adult, especially a black woman, or are of [place], [place], [place], or [place] origin. In sarcoidosis, small lumps (also called nodules or granulomas) develop in multiple organs of the body. These granulomas are small collections of inflamed cells. They commonly appear in the lungs. This is the most common organ affected. They also occur in the lymph nodes (your glands), skin, liver, and eyes. The granulomas vary in the amount of disease they produce from very little with no problems (symptoms) to causing severe illness. The cause of sarcoidosis is not known. It may be due to an abnormal immune reaction in the body. Most people will recover. A few people will develop long lasting conditions that may get worse. Women are affected  more often than men. The majority of those affected are under forty years of age. Because we do not know the cause, we do not have ways to prevent it. SYMPTOMS   Fever.  Loss of appetite.  Night sweats.  Joint pain.  Aching muscles Symptoms vary because the disease affects different parts of the body in different people. Most people who see their caregiver with sarcoidosis have lung problems. The first signs are usually a dry cough and shortness of breath. There may also be wheezing, chest pain, or a cough that brings up bloody mucus. In severe cases, lung function may become so poor that the person cannot perform even the simple routine tasks of daily life. Other symptoms of sarcoidosis are less common than lung symptoms. They can include:  Skin symptoms. Sarcoidosis can appear as a collection of tender, red bumps called erythema nodosum. These bumps usually occur on the face, shins, and arms. They can also occur as a scaly, purplish discoloration on the nose, cheeks, and ears. This is called lupus pernio. Less often, sarcoidosis causes cysts, pimples, or disfiguring over growths of skin. In many cases, the disfiguring over growths develop in areas of scars or tattoos.  Eye symptoms. These include redness, eye pain, and sensitivity to light.  Heart symptoms. These include irregular heartbeat and heart failure.  Other symptoms. A person may have paralyzed facial muscles, seizures, psychiatric symptoms, swollen salivary glands, or bone pain. DIAGNOSIS  Even when there are no symptoms, your caregiver can sometimes pick up signs of sarcoidosis during a routine examination, usually through a chest x-ray or  when checking other complaints. The patient's age and race or ethnic group can raise an additional red flag that a sign or symptom could be related to sarcoidosis.   Enlargement of the salivary or tear glands and cysts in bone tissue may also be caused by sarcoidosis.  You may have had a  biopsy done that shows signs of sarcoidosis. A biopsy is a small tissue sample that is removed for laboratory testing. This tissue sample can be taken from your lung, skin, lip, or another inflamed or abnormal area of the body.  You may have had an abnormal chest X-ray. Although you appear healthy, a chest X-ray ordered for other reasons may turn up abnormalities that suggest sarcoidosis.  Other tests may be needed. These tests may be done to rule out other illnesses or to determine the amount of organ damage caused by sarcoidosis. Some of the most common tests are:  Blood levels of calcium or angiotensin-converting enzyme may be high in people with sarcoidosis.  Blood tests to evaluate how well your liver is functioning.  Lung function tests to measure how well you are breathing.  A complete eye examination. TREATMENT  If sarcoidosis does not cause any problems, treatment may not be necessary. Your caregiver may decide to simply monitor your condition. As part of this monitoring process, you may have frequent office visits, follow-up chest X-rays, and tests of your lung function.If you have signs of moderate or severe lung disease, your doctor may recommend:  A corticosteroid drug, such as prednisone (sold under several brand names).  Corticosteroids also are used to treat sarcoidosis of the eyes, joints, skin, nerves, or heart.  Corticosteroid eye drops may be used for the eyes.  Over-the-counter medications like nonsteroidal anti-inflammatory drugs (NSAID) often are used to treat joint pain first before corticosteroids, which tend to have more side effects.  If corticosteroids are not effective or cause serious side effects, other drugs that alter or suppress the immune system may be used.  In rare cases, when sarcoidosis causes life-threatening lung disease, a lung transplant may be necessary. However, there is some risk that the new lungs also will be attacked by sarcoidosis. SEEK  IMMEDIATE MEDICAL CARE IF:   You suffer from shortness of breath or a lingering cough.  You develop new problems that may be related to the disease. Remember this disease can affect almost all organs of the body and cause many different problems. Document Released: 04/09/2004 Document Revised: 09/01/2011 Document Reviewed: 10/05/2013 [facility] Patient Information 2015 [place]. This information is not intended to replace advice given to you by your health care provider. Make sure you discuss any questions you have with your health care provider.

## 2014-11-24 ENCOUNTER — Ambulatory Visit: Payer: BLUE CROSS/BLUE SHIELD | Admitting: Urology

## 2015-03-28 ENCOUNTER — Emergency Department (HOSPITAL_COMMUNITY): Payer: Medicaid Other

## 2015-03-28 ENCOUNTER — Encounter (HOSPITAL_COMMUNITY): Payer: Self-pay | Admitting: Emergency Medicine

## 2015-03-28 ENCOUNTER — Inpatient Hospital Stay (HOSPITAL_COMMUNITY)
Admission: EM | Admit: 2015-03-28 | Discharge: 2015-03-29 | DRG: 202 | Disposition: A | Discharge status: Home / Self Care | Payer: Medicaid Other | Attending: Internal Medicine | Admitting: Internal Medicine

## 2015-03-28 DIAGNOSIS — J209 Acute bronchitis, unspecified: Principal | ICD-10-CM | POA: Diagnosis present

## 2015-03-28 DIAGNOSIS — R109 Unspecified abdominal pain: Secondary | ICD-10-CM | POA: Diagnosis present

## 2015-03-28 DIAGNOSIS — Z8711 Personal history of peptic ulcer disease: Secondary | ICD-10-CM

## 2015-03-28 DIAGNOSIS — I959 Hypotension, unspecified: Secondary | ICD-10-CM | POA: Diagnosis present

## 2015-03-28 DIAGNOSIS — D860 Sarcoidosis of lung: Secondary | ICD-10-CM | POA: Diagnosis present

## 2015-03-28 DIAGNOSIS — Z800 Family history of malignant neoplasm of digestive organs: Secondary | ICD-10-CM

## 2015-03-28 DIAGNOSIS — R1013 Epigastric pain: Secondary | ICD-10-CM

## 2015-03-28 DIAGNOSIS — R0602 Shortness of breath: Secondary | ICD-10-CM | POA: Diagnosis present

## 2015-03-28 DIAGNOSIS — Z803 Family history of malignant neoplasm of breast: Secondary | ICD-10-CM | POA: Diagnosis not present

## 2015-03-28 DIAGNOSIS — Z9981 Dependence on supplemental oxygen: Secondary | ICD-10-CM | POA: Diagnosis not present

## 2015-03-28 DIAGNOSIS — G8929 Other chronic pain: Secondary | ICD-10-CM | POA: Diagnosis present

## 2015-03-28 DIAGNOSIS — Z87891 Personal history of nicotine dependence: Secondary | ICD-10-CM

## 2015-03-28 DIAGNOSIS — Z801 Family history of malignant neoplasm of trachea, bronchus and lung: Secondary | ICD-10-CM | POA: Diagnosis not present

## 2015-03-28 DIAGNOSIS — R6510 Systemic inflammatory response syndrome (SIRS) of non-infectious origin without acute organ dysfunction: Secondary | ICD-10-CM | POA: Diagnosis present

## 2015-03-28 DIAGNOSIS — J9601 Acute respiratory failure with hypoxia: Secondary | ICD-10-CM | POA: Diagnosis present

## 2015-03-28 DIAGNOSIS — I9589 Other hypotension: Secondary | ICD-10-CM | POA: Diagnosis not present

## 2015-03-28 DIAGNOSIS — D869 Sarcoidosis, unspecified: Secondary | ICD-10-CM | POA: Diagnosis present

## 2015-03-28 DIAGNOSIS — J9600 Acute respiratory failure, unspecified whether with hypoxia or hypercapnia: Secondary | ICD-10-CM | POA: Diagnosis present

## 2015-03-28 LAB — BASIC METABOLIC PANEL
Anion gap: 10 (ref 5–15)
BUN: 14 mg/dL (ref 6–20)
CO2: 24 mmol/L (ref 22–32)
Calcium: 9 mg/dL (ref 8.9–10.3)
Chloride: 105 mmol/L (ref 101–111)
Creatinine, Ser: 0.53 mg/dL (ref 0.44–1.00)
GFR calc Af Amer: >60 mL/min (ref >60)
GLUCOSE: 92 mg/dL (ref 65–99)
POTASSIUM: 4.2 mmol/L (ref 3.5–5.1)
Sodium: 139 mmol/L (ref 135–145)

## 2015-03-28 LAB — PROCALCITONIN: Procalcitonin: <0.1 ng/mL

## 2015-03-28 LAB — CBC WITH DIFFERENTIAL/PLATELET
Basophils Absolute: 0.1 10*3/uL (ref 0.0–0.1)
Basophils Relative: 1 %
Eosinophils Absolute: 0.3 10*3/uL (ref 0.0–0.7)
Eosinophils Relative: 2 %
HEMATOCRIT: 42 % (ref 36.0–46.0)
Hemoglobin: 13.9 g/dL (ref 12.0–15.0)
LYMPHS ABS: 5.6 10*3/uL — AB (ref 0.7–4.0)
LYMPHS PCT: 40 %
MCH: 30.5 pg (ref 26.0–34.0)
MCHC: 33.1 g/dL (ref 30.0–36.0)
MCV: 92.1 fL (ref 78.0–100.0)
MONO ABS: 1.1 10*3/uL — AB (ref 0.1–1.0)
Monocytes Relative: 8 %
NEUTROS ABS: 7.2 10*3/uL (ref 1.7–7.7)
Neutrophils Relative %: 49 %
PLATELETS: 516 10*3/uL — AB (ref 150–400)
RBC: 4.56 MIL/uL (ref 3.87–5.11)
RDW: 13.2 % (ref 11.5–15.5)
WBC: 14.3 10*3/uL — ABNORMAL HIGH (ref 4.0–10.5)

## 2015-03-28 LAB — LACTIC ACID, PLASMA: Lactic Acid, Venous: 2.5 mmol/L (ref 0.5–2.0)

## 2015-03-28 LAB — PROTIME-INR
INR: 1.11 (ref 0.00–1.49)
PROTHROMBIN TIME: 14.5 s (ref 11.6–15.2)

## 2015-03-28 LAB — APTT: aPTT: 32 seconds (ref 24–37)

## 2015-03-28 MED ORDER — VANCOMYCIN HCL IN DEXTROSE 750-5 MG/150ML-% IV SOLN
750.0000 mg | Freq: Two times a day (BID) | INTRAVENOUS | Status: DC
  Administered 2015-03-29: 750 mg via INTRAVENOUS
  Filled 2015-03-28 (×3): qty 150

## 2015-03-28 MED ORDER — METHYLPREDNISOLONE SODIUM SUCC 125 MG IJ SOLR
125.0000 mg | Freq: Two times a day (BID) | INTRAMUSCULAR | Status: DC
  Administered 2015-03-29: 125 mg via INTRAVENOUS
  Filled 2015-03-28: qty 2

## 2015-03-28 MED ORDER — IPRATROPIUM-ALBUTEROL 0.5-2.5 (3) MG/3ML IN SOLN
3.0000 mL | Freq: Once | RESPIRATORY_TRACT | Status: AC
  Administered 2015-03-28: 3 mL via RESPIRATORY_TRACT
  Filled 2015-03-28: qty 3

## 2015-03-28 MED ORDER — SODIUM CHLORIDE 0.9 % IV BOLUS (SEPSIS)
1000.0000 mL | INTRAVENOUS | Status: AC
  Administered 2015-03-28: 1000 mL via INTRAVENOUS

## 2015-03-28 MED ORDER — METHYLPREDNISOLONE SODIUM SUCC 125 MG IJ SOLR: 125.0000 mg | Freq: Two times a day (BID) | INTRAMUSCULAR | Status: DC

## 2015-03-28 MED ORDER — ALBUTEROL SULFATE HFA 108 (90 BASE) MCG/ACT IN AERS
2.0000 | INHALATION_SPRAY | RESPIRATORY_TRACT | Status: DC | PRN
  Administered 2015-03-28: 2 via RESPIRATORY_TRACT
  Filled 2015-03-28: qty 6.7

## 2015-03-28 MED ORDER — ALPRAZOLAM 0.5 MG PO TABS
0.5000 mg | ORAL_TABLET | Freq: Three times a day (TID) | ORAL | Status: DC | PRN
  Administered 2015-03-28 – 2015-03-29 (×2): 0.5 mg via ORAL
  Filled 2015-03-28 (×2): qty 1

## 2015-03-28 MED ORDER — IPRATROPIUM BROMIDE 0.02 % IN SOLN: 0.5000 mg | Freq: Once | RESPIRATORY_TRACT | Status: DC

## 2015-03-28 MED ORDER — ALBUTEROL SULFATE (2.5 MG/3ML) 0.083% IN NEBU
2.5000 mg | INHALATION_SOLUTION | Freq: Once | RESPIRATORY_TRACT | Status: AC
  Administered 2015-03-28: 2.5 mg via RESPIRATORY_TRACT
  Filled 2015-03-28: qty 3

## 2015-03-28 MED ORDER — ALBUTEROL SULFATE (2.5 MG/3ML) 0.083% IN NEBU: 5.0000 mg | INHALATION_SOLUTION | Freq: Once | RESPIRATORY_TRACT | Status: DC

## 2015-03-28 MED ORDER — IPRATROPIUM-ALBUTEROL 0.5-2.5 (3) MG/3ML IN SOLN
3.0000 mL | RESPIRATORY_TRACT | Status: DC
  Administered 2015-03-28 – 2015-03-29 (×4): 3 mL via RESPIRATORY_TRACT
  Filled 2015-03-28 (×4): qty 3

## 2015-03-28 MED ORDER — PIPERACILLIN-TAZOBACTAM 3.375 G IVPB 30 MIN
3.3750 g | Freq: Once | INTRAVENOUS | Status: AC
  Administered 2015-03-28: 3.375 g via INTRAVENOUS
  Filled 2015-03-28: qty 50

## 2015-03-28 MED ORDER — METHYLPREDNISOLONE SODIUM SUCC 125 MG IJ SOLR
125.0000 mg | Freq: Once | INTRAMUSCULAR | Status: AC
  Administered 2015-03-28: 125 mg via INTRAVENOUS
  Filled 2015-03-28: qty 2

## 2015-03-28 MED ORDER — VANCOMYCIN HCL IN DEXTROSE 1-5 GM/200ML-% IV SOLN
1000.0000 mg | Freq: Once | INTRAVENOUS | Status: AC
  Administered 2015-03-29: 1000 mg via INTRAVENOUS
  Filled 2015-03-28: qty 200

## 2015-03-28 MED ORDER — PIPERACILLIN-TAZOBACTAM 3.375 G IVPB
3.3750 g | Freq: Three times a day (TID) | INTRAVENOUS | Status: DC
  Administered 2015-03-29: 3.375 g via INTRAVENOUS
  Filled 2015-03-28 (×5): qty 50

## 2015-03-28 NOTE — ED Provider Notes (Signed)
CSN: [id number]     Arrival date & time 03/28/15  1707 History   First MD Initiated Contact with Patient 03/28/15 1854     Chief Complaint  Patient presents with  . Shortness of Breath     (Consider location/radiation/quality/duration/timing/severity/associated sxs/prior Treatment) Patient is a 44 y.o. female presenting with shortness of breath. The history is provided by the patient.  Shortness of Breath Severity:  Severe Associated symptoms: chest pain and wheezing   Associated symptoms: no abdominal pain, no headaches, no rash and no vomiting    patient had increasing shortness of breath over the last 3 weeks. She was seen at her primary doctor and started on doxycycline. She had had a cough with some reproduction 3 weeks ago. States she has not gotten better dissection done worse. She has a history of sarcoidosis. Her pulmonologist at [facility] and she has not seen them in a long time. No fevers or chills. She's had a little bit of a cough. States her nebulizers help a little bit but then quickly returns. No abdominal pain. States the pain is worse if she lays back.  Past Medical History  Diagnosis Date  . Sarcoidosis (HCC)     chronic pain medication  . 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 COPD (chronic obstructive pulmonary disease) (HCC)   . Asthma   . Anxiety   . Oxygen dependent     prn basis  . Shortness of breath   . Gastric ulcer with hemorrhage 2013    gastric ulcer surgery   Past Surgical History  Procedure Laterality Date  . Cesarean section  1993  . Lung biopsy  [id number]  . Tonsillectomy  2008  . Back surgery  2008  . Abdominal hysterectomy  2003  . Esophagogastroduodenoscopy  March 2014    Dr. Renae Fickle Oh: 1 crater gastric ulcer in the antrum. Esophagus normal. Biopsying benign, no H. pylori.  . Colonoscopy      age 78, 52. normal per patient  . Esophagogastroduodenoscopy  January 2014    Dr. Renae Fickle Oh: Gastric ulcer, normal  esophagus, biopsies benign, no H. pylori  . Esophagogastroduodenoscopy  October 2013    Dr. Renae Fickle Oh: Large gastric ulcer with visible vessel injected with epinephrine. No biopsy done.  . Esophagogastroduodenoscopy (egd) with esophageal dilation N/A 04/13/2013    RMR: Extensive ulceration/ scarring/deformity of the antral/prepyloric gastric mucosa as described above-status post bx  . Cataract extraction w/phaco Left 04/17/2014    Procedure: CATARACT EXTRACTION PHACO AND INTRAOCULAR LENS PLACEMENT LEFT EYE CDE=4.91;  Surgeon: Gemma Payor, MD;  Location: [facility];  Service: Ophthalmology;  Laterality: Left;  . Cataract extraction w/phaco Right 05/25/2014    Procedure: CATARACT EXTRACTION PHACO AND INTRAOCULAR LENS PLACEMENT RIGHT EYE;  Surgeon: Gemma Payor, MD;  Location: [facility];  Service: Ophthalmology;  Laterality: Right;  CDE:3.09   Family History  Problem Relation Age of Onset  . Colon cancer Maternal Aunt     greater than age 4  . Liver disease Neg Hx   . Breast cancer Mother   . Lung cancer Father    Social History  Substance Use Topics  . Smoking status: Former Smoker -- 0.25 packs/day    Types: Cigarettes  . Smokeless tobacco: None     Comment: 1/2 pack a day  . Alcohol Use: No   OB History    No data available     Review of Systems  Constitutional: Positive  for appetite change and fatigue. Negative for activity change.  Eyes: Negative for pain.  Respiratory: Positive for shortness of breath and wheezing. Negative for chest tightness.   Cardiovascular: Positive for chest pain. Negative for leg swelling.  Gastrointestinal: Negative for nausea, vomiting, abdominal pain and diarrhea.  Genitourinary: Negative for flank pain.  Musculoskeletal: Negative for back pain and neck stiffness.  Skin: Negative for rash.  Neurological: Negative for weakness, numbness and headaches.  Psychiatric/Behavioral: Negative for behavioral problems.      Allergies  Coreg; Darvocet; Morphine and  related; Motrin; and Phenobarbital  Home Medications   Prior to Admission medications   Medication Sig Start Date End Date Taking? Authorizing Provider  dexlansoprazole (DEXILANT) 60 MG capsule Take 1 capsule (60 mg total) by mouth daily. 10/18/13  Yes Tiffany Kocher, PA-C  doxycycline (VIBRA-TABS) 100 MG tablet Take 100 mg by mouth 2 (two) times daily. Started 03/22/15   Yes Historical Provider, MD  oxyCODONE-acetaminophen (PERCOCET) 7.5-325 MG per tablet Take 1 tablet by mouth 2 (two) times daily as needed for pain.   Yes Historical Provider, MD  predniSONE (DELTASONE) 10 MG tablet Take 10 mg by mouth daily.   Yes Historical Provider, MD  SAPHRIS 5 MG SUBL 24 hr tablet Place 5 mg under the tongue 2 (two) times daily. 06/08/14  Yes Historical Provider, MD  sucralfate (CARAFATE) 1 GM/10ML suspension Take 10 mLs (1 g total) by mouth 4 (four) times daily -  with meals and at bedtime. 10/18/13  Yes Tiffany Kocher, PA-C  albuterol (PROVENTIL HFA;VENTOLIN HFA) 108 (90 BASE) MCG/ACT inhaler Inhale 2 puffs into the lungs every 6 (six) hours as needed for wheezing or shortness of breath.    Historical Provider, MD  albuterol (PROVENTIL) (2.5 MG/3ML) 0.083% nebulizer solution Take 2.5 mg by nebulization every 6 (six) hours as needed for wheezing.    Historical Provider, MD  fentaNYL (DURAGESIC - DOSED MCG/HR) 25 MCG/HR patch Place 25 mcg onto the skin every 3 (three) days.     Historical Provider, MD  levofloxacin (LEVAQUIN) 750 MG tablet Take 1 tablet (750 mg total) by mouth daily. Start taking on 10/26/14 Patient not taking: Reported on 03/28/2015 10/25/14   Tilden Fossa, MD  polyvinyl alcohol-povidone (HYPOTEARS) 1.4-0.6 % ophthalmic solution Place 1-2 drops into both eyes as needed. Dry eyes    Historical Provider, MD  predniSONE (DELTASONE) 10 MG tablet Take 1 tablet (10 mg total) by mouth daily. Take two tablets by mouth daily for seven (7) days then one and a half tablets by mouth daily for seven (7) days.   Resume your regular prednisone dosing when complete. Patient not taking: Reported on 03/28/2015 10/25/14   Tilden Fossa, MD  promethazine (PHENERGAN) 25 MG tablet Take 25 mg by mouth every 4 (four) hours as needed. FOR NAUSEA AND VOMITING 06/08/14   Historical Provider, MD   BP 135/91 mmHg  Pulse 82  Temp(Src) 98.2 F (36.8 C) (Oral)  Resp 13  Ht  (1.727 m)  Wt 113 lb (51.256 kg)  BMI 17.19 kg/m2  SpO2 91% Physical Exam  Constitutional: She appears well-developed.  HENT:  Head: Atraumatic.  Neck: Neck supple.  Cardiovascular: Normal rate.   Pulmonary/Chest: She has wheezes.  Diffusion harsh wheezes with some dyspnea.  Abdominal: Soft. There is no tenderness.  Musculoskeletal: She exhibits no edema.  Skin: Skin is warm.    ED Course  Procedures (including critical care time) Labs Review Labs Reviewed  CBC WITH DIFFERENTIAL/PLATELET - Abnormal;  Notable for the following:    WBC 14.3 (*)    Platelets 516 (*)    Lymphs Abs 5.6 (*)    Monocytes Absolute 1.1 (*)    All other components within normal limits  LACTIC ACID, PLASMA - Abnormal; Notable for the following:    Lactic Acid, Venous 2.5 (*)    All other components within normal limits  CULTURE, BLOOD (ROUTINE X 2)  CULTURE, BLOOD (ROUTINE X 2)  BASIC METABOLIC PANEL  PROCALCITONIN  PROTIME-INR  APTT  LACTIC ACID, PLASMA    Imaging Review Dg Chest 2 View  03/28/2015   CLINICAL DATA:  Shortness of breath with productive cough for 3 weeks. History of sarcoidosis, COPD and asthma. Former smoker.  EXAM: CHEST  2 VIEW  COMPARISON:  03/22/2015; 10/25/2014; 09/26/2014; 04/07/2012; chest CT - 10/25/2014; 04/07/2012  FINDINGS: Grossly unchanged cardiac silhouette and mediastinal contours. The lungs remain hyperexpanded with flattening of the bilateral diaphragms. Ill-defined nodular opacities overlying the right lower lung as well as the linear heterogeneous opacity with the right upper lung are unchanged since the 03/2012  examination. No new focal airspace opacities. No pleural effusion or pneumothorax. No evidence of edema. No acute osseus abnormalities.  IMPRESSION: 1. Hyperexpanded lungs without acute cardiopulmonary disease. Specifically, no new focal airspace opacities to suggest pneumonia. 2. Ill-defined nodular opacity overlying the right lower lung as well as linear heterogeneous opacity with the right upper lung are unchanged since the 03/2012 examination as well as the 04/07/2012 chest CT. Given stability, these are favored to be attributable to provided history of pulmonary sarcoidosis.   Electronically Signed   By: Simonne Come M.D.   On: 03/28/2015 17:42   I have personally reviewed and evaluated these images and lab results as part of my medical decision-making.   EKG Interpretation   Date/Time:  Wednesday March 28 2015 19:23:05 EDT Ventricular Rate:  68 PR Interval:  137 QRS Duration: 83 QT Interval:  397 QTC Calculation: 422 R Axis:   87 Text Interpretation:  Sinus rhythm No significant change since last  tracing Confirmed by Rubin Payor  MD, Harrold Donath [phone]) on 03/28/2015 8:48:28 PM      MDM   Final diagnoses:  Sarcoidosis of lung (HCC)     patient shortness of breath. Likely sarcoid exacerbation. Increasing oxygen requirement. Has been on antibiotics. Will to internal medicine.    Benjiman Core, MD 03/28/15 [phone]

## 2015-03-28 NOTE — Progress Notes (Signed)
ANTIBIOTIC CONSULT NOTE - INITIAL  Pharmacy Consult for Vancomycin and Zosyn Indication: rule out sepsis  Allergies  Allergen Reactions  . Coreg [Carvedilol] Other (See Comments)    Hair loss/mood swings  . Darvocet [Propoxyphene N-Acetaminophen] Other (See Comments)    blisters  . Morphine And Related Other (See Comments)    blisters  . Motrin [Ibuprofen] Other (See Comments)    blisters  . Phenobarbital Other (See Comments)    Child hood allergy    Patient Measurements: Height:  (172.7 cm) Weight: 113 lb (51.256 kg) IBW/kg (Calculated) : 63.9   Vital Signs: Temp: 98.2 F (36.8 C) (10/05 1721) Temp Source: Oral (10/05 1721) BP: 91/51 mmHg (10/05 2200) Pulse Rate: 87 (10/05 2200) Intake/Output from previous day:   Intake/Output from this shift:    Labs:  Recent Labs  03/28/15 1945  WBC 14.3*  HGB 13.9  PLT 516*  CREATININE 0.53   Estimated Creatinine Clearance: 71.2 mL/min (by C-G formula based on Cr of 0.53). No results for input(s): VANCOTROUGH, VANCOPEAK, VANCORANDOM, GENTTROUGH, GENTPEAK, GENTRANDOM, TOBRATROUGH, TOBRAPEAK, TOBRARND, AMIKACINPEAK, AMIKACINTROU, AMIKACIN in the last 72 hours.   Microbiology: No results found for this or any previous visit (from the past 720 hour(s)).  Medical History: Past Medical History  Diagnosis Date  . Sarcoidosis (HCC)     chronic pain medication  . 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 COPD (chronic obstructive pulmonary disease) (HCC)   . Asthma   . Anxiety   . Oxygen dependent     prn basis  . Shortness of breath   . Gastric ulcer with hemorrhage 2013    gastric ulcer surgery    Medications:  Scheduled:  . [START ON 03/29/2015] ipratropium-albuterol  3 mL Nebulization Q4H  . [START ON 03/29/2015] methylPREDNISolone (SOLU-MEDROL) injection  125 mg Intravenous Q12H   Assessment: 44 yo with SOB, recently on doxycycline for productive cough. She is wheezing and  subjective fevers and chills for 5 days. Acute respiratory failure, possible sepsis. Start broad spectrum abx with Zosyn and Vancomycin  Goal of Therapy:  Vancomycin trough level 15-20 mcg/ml  Plan:  Zosyn 3.375gm iv q8h extended interval dosing Vancomycin 1gm loading dose then  iv q12h. Monitor labs and V/S Measure antibiotic drug levels at steady state Follow up culture results  Elder Cyphers, BS Loura Back, BCPS Clinical Pharmacist Pager [phone]  03/28/2015,10:46 PM

## 2015-03-28 NOTE — ED Notes (Signed)
Pt sitting in waiting area in no distress.  Talking on phone at this time.

## 2015-03-28 NOTE — ED Notes (Signed)
Patient states she was treated last week at [facility] and told she "had an infection in my lungs." Patient states she was given doxycycline and is feeling no better. States home nebulizer treatments are not working.

## 2015-03-28 NOTE — H&P (Signed)
[facility] Admission History and Physical       Laura Haynes ZOX:[id number] DOB: 1970/09/10 DOA: 03/28/2015  Referring physician: EDP PCP: PROVIDER NOT IN SYSTEM  Specialists:   Chief Complaint: SOB and increased Wheezing  HPI: Laura Haynes is a 44 y.o. female with a history of Sarcoidosis who presents to the ED with complaints of worsening SOB, and Wheezes and subjective fevers and chills for the past 5 days.  She was prescribed Doxycycline and reports that initially her cough was productive of greenish sputum, but now the color is clear.   She was found to have hypoxia on presentation to the ED and was placed on 2 liters NCO2 and had improvement.  She was administered IV Solumedrol and nebulizer treatements and was referred for admission.    Review of Systems:  Constitutional: No Weight Loss, No Weight Gain, Night Sweats, +Fevers, +Chills, Dizziness, Light Headedness, Fatigue, or Generalized Weakness HEENT: No Headaches, Difficulty Swallowing,Tooth/Dental Problems,Sore Throat,  No Sneezing, Rhinitis, Ear Ache, Nasal Congestion, or Post Nasal Drip,  Cardio-vascular:  No Chest pain, Orthopnea, PND, Edema in Lower Extremities, Anasarca, Dizziness, Palpitations  Resp: No Dyspnea, No DOE, No Productive Cough, No Non-Productive Cough, No Hemoptysis, +Wheezing.    GI: No Heartburn, Indigestion, Abdominal Pain, Nausea, Vomiting, Diarrhea, Constipation, Hematemesis, Hematochezia, Melena, Change in Bowel Habits,  Loss of Appetite  GU: No Dysuria, No Change in Color of Urine, No Urgency or Urinary Frequency, No Flank pain.  Musculoskeletal: No Joint Pain or Swelling, No Decreased Range of Motion, No Back Pain.  Neurologic: No Syncope, No Seizures, Muscle Weakness, Paresthesia, Vision Disturbance or Loss, No Diplopia, No Vertigo, No Difficulty Walking,  Skin: No Rash or Lesions. Psych: No Change in Mood or Affect, No Depression or Anxiety, No Memory loss, No Confusion, or  Hallucinations   Past Medical History  Diagnosis Date  . Sarcoidosis (HCC)     chronic pain medication  . 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 COPD (chronic obstructive pulmonary disease) (HCC)   . Asthma   . Anxiety   . Oxygen dependent     prn basis  . Shortness of breath   . Gastric ulcer with hemorrhage 2013    gastric ulcer surgery     Past Surgical History  Procedure Laterality Date  . Cesarean section  1993  . Lung biopsy  [id number]  . Tonsillectomy  2008  . Back surgery  2008  . Abdominal hysterectomy  2003  . Esophagogastroduodenoscopy  March 2014    Dr. Renae Fickle Oh: 1 crater gastric ulcer in the antrum. Esophagus normal. Biopsying benign, no H. pylori.  . Colonoscopy      age 12, 37. normal per patient  . Esophagogastroduodenoscopy  January 2014    Dr. Renae Fickle Oh: Gastric ulcer, normal esophagus, biopsies benign, no H. pylori  . Esophagogastroduodenoscopy  October 2013    Dr. Renae Fickle Oh: Large gastric ulcer with visible vessel injected with epinephrine. No biopsy done.  . Esophagogastroduodenoscopy (egd) with esophageal dilation N/A 04/13/2013    RMR: Extensive ulceration/ scarring/deformity of the antral/prepyloric gastric mucosa as described above-status post bx  . Cataract extraction w/phaco Left 04/17/2014    Procedure: CATARACT EXTRACTION PHACO AND INTRAOCULAR LENS PLACEMENT LEFT EYE CDE=4.91;  Surgeon: Gemma Payor, MD;  Location: [facility];  Service: Ophthalmology;  Laterality: Left;  . Cataract extraction w/phaco Right 05/25/2014    Procedure: CATARACT EXTRACTION PHACO AND INTRAOCULAR LENS PLACEMENT RIGHT EYE;  Surgeon: Gemma Payor, MD;  Location: [facility];  Service: Ophthalmology;  Laterality: Right;  CDE:3.09      Prior to Admission medications   Medication Sig Start Date End Date Taking? Authorizing Provider  dexlansoprazole (DEXILANT) 60 MG capsule Take 1 capsule (60 mg total) by mouth daily. 10/18/13  Yes Tiffany Kocher, PA-C    doxycycline (VIBRA-TABS) 100 MG tablet Take 100 mg by mouth 2 (two) times daily. Started 03/22/15   Yes Historical Provider, MD  oxyCODONE-acetaminophen (PERCOCET) 7.5-325 MG per tablet Take 1 tablet by mouth 2 (two) times daily as needed for pain.   Yes Historical Provider, MD  predniSONE (DELTASONE) 10 MG tablet Take 10 mg by mouth daily.   Yes Historical Provider, MD  SAPHRIS 5 MG SUBL 24 hr tablet Place 5 mg under the tongue 2 (two) times daily. 06/08/14  Yes Historical Provider, MD  sucralfate (CARAFATE) 1 GM/10ML suspension Take 10 mLs (1 g total) by mouth 4 (four) times daily -  with meals and at bedtime. 10/18/13  Yes Tiffany Kocher, PA-C  albuterol (PROVENTIL HFA;VENTOLIN HFA) 108 (90 BASE) MCG/ACT inhaler Inhale 2 puffs into the lungs every 6 (six) hours as needed for wheezing or shortness of breath.    Historical Provider, MD  albuterol (PROVENTIL) (2.5 MG/3ML) 0.083% nebulizer solution Take 2.5 mg by nebulization every 6 (six) hours as needed for wheezing.    Historical Provider, MD  fentaNYL (DURAGESIC - DOSED MCG/HR) 25 MCG/HR patch Place 25 mcg onto the skin every 3 (three) days.     Historical Provider, MD  levofloxacin (LEVAQUIN) 750 MG tablet Take 1 tablet (750 mg total) by mouth daily. Start taking on 10/26/14 Patient not taking: Reported on 03/28/2015 10/25/14   Tilden Fossa, MD  polyvinyl alcohol-povidone (HYPOTEARS) 1.4-0.6 % ophthalmic solution Place 1-2 drops into both eyes as needed. Dry eyes    Historical Provider, MD  predniSONE (DELTASONE) 10 MG tablet Take 1 tablet (10 mg total) by mouth daily. Take two tablets by mouth daily for seven (7) days then one and a half tablets by mouth daily for seven (7) days.  Resume your regular prednisone dosing when complete. Patient not taking: Reported on 03/28/2015 10/25/14   Tilden Fossa, MD  promethazine (PHENERGAN) 25 MG tablet Take 25 mg by mouth every 4 (four) hours as needed. FOR NAUSEA AND VOMITING 06/08/14   Historical Provider, MD      Allergies  Allergen Reactions  . Coreg [Carvedilol] Other (See Comments)    Hair loss/mood swings  . Darvocet [Propoxyphene N-Acetaminophen] Other (See Comments)    blisters  . Morphine And Related Other (See Comments)    blisters  . Motrin [Ibuprofen] Other (See Comments)    blisters  . Phenobarbital Other (See Comments)    Child hood allergy    Social History:  reports that she has quit smoking. Her smoking use included Cigarettes. She smoked 0.25 packs per day. She does not have any smokeless tobacco history on file. She reports that she does not drink alcohol or use illicit drugs.    Family History  Problem Relation Age of Onset  . Colon cancer Maternal Aunt     greater than age 45  . Liver disease Neg Hx   . Breast cancer Mother   . Lung cancer Father        Physical Exam:  GEN:  Pleasant  Thin  44 y.o. Caucasian female examined and in no acute distress; cooperative with exam Filed Vitals:  03/28/15 2030 03/28/15 2100 03/28/15 2130 03/28/15 2139  BP: 112/65 115/70 113/67   Pulse: 90 77 92   Temp:      TempSrc:      Resp: 30 17 18    Height:      Weight:      SpO2: 90% 92% 90% 92%   Blood pressure 113/67, pulse 92, temperature 98.2 F (36.8 C), temperature source Oral, resp. rate 18, height 5\' 8"  (1.727 m), weight 51.256 kg (113 lb), SpO2 92 %. PSYCH: She is alert and oriented x4; does not appear anxious does not appear depressed; affect is normal HEENT: Normocephalic and Atraumatic, Mucous membranes pink; PERRLA; EOM intact; Fundi:  Benign;  No scleral icterus, Nares: Patent, Oropharynx: Clear, Missing Upper 4 Front Teeth Fair Dentition,    Neck:  FROM, No Cervical Lymphadenopathy nor Thyromegaly or Carotid Bruit; No JVD; Breasts:: Not examined CHEST WALL: No tenderness CHEST: Decreased Breath Sounds Occasional Expiratory Wheezes   HEART: Regular rate and rhythm; no murmurs rubs or gallops BACK: No kyphosis or scoliosis; No CVA tenderness ABDOMEN:  Positive Bowel Sounds, Scaphoid, Soft Non-Tender, No Rebound or Guarding; No Masses, No Organomegaly Rectal Exam: Not done EXTREMITIES: No Cyanosis, Clubbing, or Edema; No Ulcerations. Genitalia: not examined PULSES: 2+ and symmetric SKIN: Normal hydration no rash or ulceration CNS:  Alert and Oriented x 4, No Focal Deficits Vascular: pulses palpable throughout    Labs on Admission:  Basic Metabolic Panel:  Recent Labs Lab 03/28/15 1945  NA 139  K 4.2  CL 105  CO2 24  GLUCOSE 92  BUN 14  CREATININE 0.53  CALCIUM 9.0   Liver Function Tests: No results for input(s): AST, ALT, ALKPHOS, BILITOT, PROT, ALBUMIN in the last 168 hours. No results for input(s): LIPASE, AMYLASE in the last 168 hours. No results for input(s): AMMONIA in the last 168 hours. CBC:  Recent Labs Lab 03/28/15 1945  WBC 14.3*  NEUTROABS 7.2  HGB 13.9  HCT 42.0  MCV 92.1  PLT 516*   Cardiac Enzymes: No results for input(s): CKTOTAL, CKMB, CKMBINDEX, TROPONINI in the last 168 hours.  BNP (last 3 results)  Recent Labs  06/14/14 2105  BNP 22.0    ProBNP (last 3 results) No results for input(s): PROBNP in the last 8760 hours.  CBG: No results for input(s): GLUCAP in the last 168 hours.  Radiological Exams on Admission: Dg Chest 2 View  03/28/2015   CLINICAL DATA:  Shortness of breath with productive cough for 3 weeks. History of sarcoidosis, COPD and asthma. Former smoker.  EXAM: CHEST  2 VIEW  COMPARISON:  03/22/2015; 10/25/2014; 09/26/2014; 04/07/2012; chest CT - 10/25/2014; 04/07/2012  FINDINGS: Grossly unchanged cardiac silhouette and mediastinal contours. The lungs remain hyperexpanded with flattening of the bilateral diaphragms. Ill-defined nodular opacities overlying the right lower lung as well as the linear heterogeneous opacity with the right upper lung are unchanged since the 03/2012 examination. No new focal airspace opacities. No pleural effusion or pneumothorax. No evidence of  edema. No acute osseus abnormalities.  IMPRESSION: 1. Hyperexpanded lungs without acute cardiopulmonary disease. Specifically, no new focal airspace opacities to suggest pneumonia. 2. Ill-defined nodular opacity overlying the right lower lung as well as linear heterogeneous opacity with the right upper lung are unchanged since the 03/2012 examination as well as the 04/07/2012 chest CT. Given stability, these are favored to be attributable to provided history of pulmonary sarcoidosis.   Electronically Signed   By: Simonne Come M.D.   On: 03/28/2015  17:42     EKG: Independently reviewed. Normal Sinus Rhythm Rate =68 No Acute S-T changes    Assessment/Plan:     44 y.o. female with  Principal Problem:   1.     Acute respiratory failure (HCC)- due to #3, and #4   NCO2 PRN   Monitor O2 Sats   Active Problems:   2.    SIRS (systemic inflammatory response syndrome) (HCC)   Sepsis workup Initiated   IV Vancomycin and Zosyn   IVFs     3.    Sarcoidosis (HCC)   IV Solumedrol   DuoNebs     4.    Acute bronchitis-   Versus Early PNA        5.    Hypotension-    IVFs   Monitor BPs     6.   History of peptic ulcer disease   Protonix     7.   Abdominal pain, chronic, epigastric   Continue Fentanyl Patch    With Oxy IR for Breakthrough Pain     8.   DVT Prophylaxis   Lovenox    Code Status:     FULL CODE      Family Communication:   Family at Bedside     Disposition Plan:    Inpatient Status        Time spent:  86 Minutes      Ron Parker [facility] Pager [phone]   If 7AM -7PM Please Contact the Day Rounding Team MD for [facility]  If 7PM-7AM, Please Contact Night-Floor Coverage  [email or web address] Password [email or web address] 03/28/2015, 10:01 PM     ADDENDUM:   Patient was seen and examined on 03/28/2015

## 2015-03-28 NOTE — ED Notes (Signed)
CRITICAL VALUE ALERT  Critical value received:  Lactic Acid 2.5  Date of notification:  03/28/2015  Time of notification:  1120  Critical value read back:Yes.    Nurse who received alert:  [email or web address]   Responding MD:  Rubin Payor

## 2015-03-29 ENCOUNTER — Encounter (HOSPITAL_COMMUNITY): Payer: Self-pay | Admitting: *Deleted

## 2015-03-29 LAB — BASIC METABOLIC PANEL
ANION GAP: 8 (ref 5–15)
BUN: 12 mg/dL (ref 6–20)
CALCIUM: 8.4 mg/dL — AB (ref 8.9–10.3)
CO2: 22 mmol/L (ref 22–32)
Chloride: 110 mmol/L (ref 101–111)
Creatinine, Ser: 0.52 mg/dL (ref 0.44–1.00)
Glucose, Bld: 138 mg/dL — ABNORMAL HIGH (ref 65–99)
POTASSIUM: 3.8 mmol/L (ref 3.5–5.1)
Sodium: 140 mmol/L (ref 135–145)

## 2015-03-29 LAB — CBC
HEMATOCRIT: 35.8 % — AB (ref 36.0–46.0)
HEMOGLOBIN: 11.6 g/dL — AB (ref 12.0–15.0)
MCH: 30.1 pg (ref 26.0–34.0)
MCHC: 32.4 g/dL (ref 30.0–36.0)
MCV: 92.7 fL (ref 78.0–100.0)
Platelets: 440 10*3/uL — ABNORMAL HIGH (ref 150–400)
RBC: 3.86 MIL/uL — AB (ref 3.87–5.11)
RDW: 13.3 % (ref 11.5–15.5)
WBC: 8.9 10*3/uL (ref 4.0–10.5)

## 2015-03-29 LAB — LACTIC ACID, PLASMA: LACTIC ACID, VENOUS: 1.8 mmol/L (ref 0.5–2.0)

## 2015-03-29 MED ORDER — PANTOPRAZOLE SODIUM 40 MG PO TBEC
40.0000 mg | DELAYED_RELEASE_TABLET | Freq: Every day | ORAL | Status: DC
  Administered 2015-03-29: 40 mg via ORAL
  Filled 2015-03-29: qty 1

## 2015-03-29 MED ORDER — PREDNISONE 10 MG PO TABS: 10.0000 mg | ORAL_TABLET | Freq: Every day | ORAL | Status: DC

## 2015-03-29 MED ORDER — SODIUM CHLORIDE 0.9 % IV SOLN
INTRAVENOUS | Status: DC
  Administered 2015-03-29 (×2): via INTRAVENOUS

## 2015-03-29 MED ORDER — FENTANYL 25 MCG/HR TD PT72: 25.0000 ug | MEDICATED_PATCH | TRANSDERMAL | Status: DC

## 2015-03-29 MED ORDER — ONDANSETRON HCL 4 MG PO TABS: 4.0000 mg | ORAL_TABLET | Freq: Four times a day (QID) | ORAL | Status: DC | PRN

## 2015-03-29 MED ORDER — ACETAMINOPHEN 325 MG PO TABS: 650.0000 mg | ORAL_TABLET | Freq: Four times a day (QID) | ORAL | Status: DC | PRN

## 2015-03-29 MED ORDER — FENTANYL 12 MCG/HR TD PT72: 12.5000 ug | MEDICATED_PATCH | TRANSDERMAL | Status: DC

## 2015-03-29 MED ORDER — HYDROMORPHONE HCL 1 MG/ML IJ SOLN: 0.5000 mg | INTRAMUSCULAR | Status: DC | PRN

## 2015-03-29 MED ORDER — ALBUTEROL SULFATE (2.5 MG/3ML) 0.083% IN NEBU: 2.5000 mg | INHALATION_SOLUTION | RESPIRATORY_TRACT | Status: DC | PRN

## 2015-03-29 MED ORDER — ONDANSETRON HCL 4 MG/2ML IJ SOLN: 4.0000 mg | Freq: Four times a day (QID) | INTRAMUSCULAR | Status: DC | PRN

## 2015-03-29 MED ORDER — ALUM & MAG HYDROXIDE-SIMETH 200-200-20 MG/5ML PO SUSP: 30.0000 mL | Freq: Four times a day (QID) | ORAL | Status: DC | PRN

## 2015-03-29 MED ORDER — ACETAMINOPHEN 650 MG RE SUPP: 650.0000 mg | Freq: Four times a day (QID) | RECTAL | Status: DC | PRN

## 2015-03-29 MED ORDER — NICOTINE 7 MG/24HR TD PT24: 7.0000 mg | MEDICATED_PATCH | Freq: Every day | TRANSDERMAL | Status: DC

## 2015-03-29 MED ORDER — LEVOFLOXACIN 750 MG PO TABS: 750.0000 mg | ORAL_TABLET | Freq: Every day | ORAL | Status: DC

## 2015-03-29 MED ORDER — ENOXAPARIN SODIUM 40 MG/0.4ML ~~LOC~~ SOLN
40.0000 mg | SUBCUTANEOUS | Status: DC
  Administered 2015-03-29: 40 mg via SUBCUTANEOUS
  Filled 2015-03-29: qty 0.4

## 2015-03-29 MED ORDER — OXYCODONE HCL 5 MG PO TABS
10.0000 mg | ORAL_TABLET | Freq: Four times a day (QID) | ORAL | Status: DC | PRN
Start: 2015-03-29 — End: 2015-03-29
  Administered 2015-03-29 (×2): 10 mg via ORAL
  Filled 2015-03-29 (×2): qty 2

## 2015-03-29 MED ORDER — PIPERACILLIN-TAZOBACTAM 3.375 G IVPB
INTRAVENOUS | Status: AC
  Filled 2015-03-29: qty 50

## 2015-03-29 MED ORDER — FENTANYL 12 MCG/HR TD PT72
12.5000 ug | MEDICATED_PATCH | TRANSDERMAL | Status: DC
  Administered 2015-03-29: 12.5 ug via TRANSDERMAL
  Filled 2015-03-29: qty 1

## 2015-03-29 NOTE — Discharge Summary (Signed)
Physician Discharge Summary  [name] XBM:[id number] DOB: 06-04-1971 DOA: 03/28/2015  PCP: PROVIDER NOT IN SYSTEM  Admit date: 03/28/2015 Discharge date: 03/29/2015  Time spent: 45 minutes  Recommendations for Outpatient Follow-up:  -Will be discharged home today. -Advised to follow up with PCP on 10/13 as already scheduled to determine continued oxygen necessity. -To continue prednisone taper and antibiotic.   Discharge Diagnoses:  Principal Problem:   Acute respiratory failure (HCC) Active Problems:   History of peptic ulcer disease   Abdominal pain, chronic, epigastric   Sarcoidosis (HCC)   Acute bronchitis   Hypotension   SIRS (systemic inflammatory response syndrome) (HCC)   Discharge Condition: Stable and improved  Filed Weights   03/28/15 1721 03/29/15 0134  Weight: 51.256 kg (113 lb) 53.615 kg (118 lb 3.2 oz)    History of present illness:  As per Dr. Lovell Sheehan on 10/5: Laura Haynes is a 44 y.o. female with a history of Sarcoidosis who presents to the ED with complaints of worsening SOB, and Wheezes and subjective fevers and chills for the past 5 days. She was prescribed Doxycycline and reports that initially her cough was productive of greenish sputum, but now the color is clear. She was found to have hypoxia on presentation to the ED and was placed on 2 liters NCO2 and had improvement. She was administered IV Solumedrol and nebulizer treatements and was referred for admission.   Hospital Course:   Acute on Chronic Hypoxemic Respiratory Failure -2/2 acute bronchitis on top of pulmonary sarcoidosis and chronic tobacco use (quit a few months ago). -Seems she has been prescribed oxygen in the past and has not be wearing it. -Have advised use of oxygen 24/7 for now until further assessed by PCP in the OP setting.  Acute Bronchitis -Vs early PNA. -Continue levaqin for 7 days, prednisone taper.  SIRS -2/2 acute bronchitis. -SIRS parameters  resolved.  Pulmonary Sarcoidosis -Continue steroids.  H/o PUD -Continue PPI.  Procedures:  None   Consultations:  None  Discharge Instructions  Discharge Instructions    Increase activity slowly    Complete by:  As directed             Medication List    STOP taking these medications        doxycycline 100 MG tablet  Commonly known as:  VIBRA-TABS      TAKE these medications        albuterol (2.5 MG/3ML) 0.083% nebulizer solution  Commonly known as:  PROVENTIL  Take 2.5 mg by nebulization every 6 (six) hours as needed for wheezing.     albuterol 108 (90 BASE) MCG/ACT inhaler  Commonly known as:  PROVENTIL HFA;VENTOLIN HFA  Inhale 2 puffs into the lungs every 6 (six) hours as needed for wheezing or shortness of breath.     dexlansoprazole 60 MG capsule  Commonly known as:  DEXILANT  Take 1 capsule (60 mg total) by mouth daily.     fentaNYL 25 MCG/HR patch  Commonly known as:  DURAGESIC - dosed mcg/hr  Place 25 mcg onto the skin every 3 (three) days.     levofloxacin 750 MG tablet  Commonly known as:  LEVAQUIN  Take 1 tablet (750 mg total) by mouth daily.     oxyCODONE-acetaminophen 7.5-325 MG tablet  Commonly known as:  PERCOCET  Take 1 tablet by mouth 2 (two) times daily as needed for pain.     polyvinyl alcohol-povidone 1.4-0.6 % ophthalmic solution  Commonly known as:  HYPOTEARS  Place 1-2 drops into both eyes as needed. Dry eyes     predniSONE 10 MG tablet  Commonly known as:  DELTASONE  Take 1 tablet (10 mg total) by mouth daily with breakfast. Take 6 tablets today and then decrease by 1 tablet daily until none are left.     predniSONE 10 MG tablet  Commonly known as:  DELTASONE  Take 10 mg by mouth daily.     promethazine 25 MG tablet  Commonly known as:  PHENERGAN  Take 25 mg by mouth every 4 (four) hours as needed. FOR NAUSEA AND VOMITING     SAPHRIS 5 MG Subl 24 hr tablet  Generic drug:  asenapine  Place 5 mg under the tongue 2  (two) times daily.     sucralfate 1 GM/10ML suspension  Commonly known as:  CARAFATE  Take 10 mLs (1 g total) by mouth 4 (four) times daily -  with meals and at bedtime.       Allergies  Allergen Reactions  . Coreg [Carvedilol] Other (See Comments)    Hair loss/mood swings  . Darvocet [Propoxyphene N-Acetaminophen] Other (See Comments)    blisters  . Morphine And Related Other (See Comments)    blisters  . Motrin [Ibuprofen] Other (See Comments)    blisters  . Phenobarbital Other (See Comments)    Child hood allergy       Follow-up Information    Please follow up.   Why:  as scheduled for 10/13 with [facility]       The results of significant diagnostics from this hospitalization (including imaging, microbiology, ancillary and laboratory) are listed below for reference.    Significant Diagnostic Studies: Dg Chest 2 View  03/28/2015   CLINICAL DATA:  Shortness of breath with productive cough for 3 weeks. History of sarcoidosis, COPD and asthma. Former smoker.  EXAM: CHEST  2 VIEW  COMPARISON:  03/22/2015; 10/25/2014; 09/26/2014; 04/07/2012; chest CT - 10/25/2014; 04/07/2012  FINDINGS: Grossly unchanged cardiac silhouette and mediastinal contours. The lungs remain hyperexpanded with flattening of the bilateral diaphragms. Ill-defined nodular opacities overlying the right lower lung as well as the linear heterogeneous opacity with the right upper lung are unchanged since the 03/2012 examination. No new focal airspace opacities. No pleural effusion or pneumothorax. No evidence of edema. No acute osseus abnormalities.  IMPRESSION: 1. Hyperexpanded lungs without acute cardiopulmonary disease. Specifically, no new focal airspace opacities to suggest pneumonia. 2. Ill-defined nodular opacity overlying the right lower lung as well as linear heterogeneous opacity with the right upper lung are unchanged since the 03/2012 examination as well as the 04/07/2012 chest CT.  Given stability, these are favored to be attributable to provided history of pulmonary sarcoidosis.   Electronically Signed   By: Simonne Come M.D.   On: 03/28/2015 17:42    Microbiology: Recent Results (from the past 240 hour(s))  Culture, blood (x 2)     Status: None (Preliminary result)   Collection Time: 03/28/15 10:29 PM  Result Value Ref Range Status   Specimen Description BLOOD LEFT HAND  Final   Special Requests BOTTLES DRAWN AEROBIC AND ANAEROBIC 4CC EACH  Final   Culture NO GROWTH < 12 HOURS  Final   Report Status PENDING  Incomplete  Culture, blood (x 2)     Status: None (Preliminary result)   Collection Time: 03/28/15 10:50 PM  Result Value Ref Range Status   Specimen Description BLOOD RIGHT HAND  Final   Special Requests  BOTTLES DRAWN AEROBIC ONLY 4CC  Final   Culture NO GROWTH < 12 HOURS  Final   Report Status PENDING  Incomplete     Labs: Basic Metabolic Panel:  Recent Labs Lab 03/28/15 1945 03/29/15 0556  NA 139 140  K 4.2 3.8  CL 105 110  CO2 24 22  GLUCOSE 92 138*  BUN 14 12  CREATININE 0.53 0.52  CALCIUM 9.0 8.4*   Liver Function Tests: No results for input(s): AST, ALT, ALKPHOS, BILITOT, PROT, ALBUMIN in the last 168 hours. No results for input(s): LIPASE, AMYLASE in the last 168 hours. No results for input(s): AMMONIA in the last 168 hours. CBC:  Recent Labs Lab 03/28/15 1945 03/29/15 0556  WBC 14.3* 8.9  NEUTROABS 7.2  --   HGB 13.9 11.6*  HCT 42.0 35.8*  MCV 92.1 92.7  PLT 516* 440*   Cardiac Enzymes: No results for input(s): CKTOTAL, CKMB, CKMBINDEX, TROPONINI in the last 168 hours. BNP: BNP (last 3 results)  Recent Labs  06/14/14 2105  BNP 22.0    ProBNP (last 3 results) No results for input(s): PROBNP in the last 8760 hours.  CBG: No results for input(s): GLUCAP in the last 168 hours.     SignedChaya Jan  [facility] Pager: [phone] 03/29/2015, 10:53 AM

## 2015-03-29 NOTE — Progress Notes (Signed)
Pt's IV catheter removed and intact. Pt's IV site clean dry and intact. Discharge instructions and medications reviewed and discussed with patient. Pt in stable condition and in no acute distress at this time.  All questions were answered and no further questions at this time. Pt escorted by nurse tech.

## 2015-03-29 NOTE — Care Management Note (Signed)
Case Management Note  Patient Details  Name: Laura Haynes MRN: [id number] Date of Birth: [birth year]  Expected Discharge Date:  04/01/15               Expected Discharge Plan:  Home/Self Care  In-House Referral:  NA  Discharge planning Services  CM Consult  Post Acute Care Choice:  NA Choice offered to:  NA  DME Arranged:    DME Agency:     HH Arranged:    HH Agency:     Status of Service:  Completed, signed off  Medicare Important Message Given:    Date Medicare IM Given:    Medicare IM give by:    Date Additional Medicare IM Given:    Additional Medicare Important Message give by:     If discussed at Long Length of Stay Meetings, dates discussed:    Additional Comments: Admitted with COPD. Pt is from home and ind at baseline. Pt has home O2 through [employer]. Pt discharging home with self care today. No CM needs noted.  Malcolm Metro, RN 03/29/2015, 11:20 AM

## 2015-04-02 LAB — CULTURE, BLOOD (ROUTINE X 2)
CULTURE: NO GROWTH
CULTURE: NO GROWTH

## 2015-05-05 LAB — HEMOGLOBIN A1C: HEMOGLOBIN A1C: 5.4

## 2015-06-01 ENCOUNTER — Other Ambulatory Visit (HOSPITAL_COMMUNITY): Payer: Self-pay | Admitting: Family Medicine

## 2015-06-01 DIAGNOSIS — R7989 Other specified abnormal findings of blood chemistry: Secondary | ICD-10-CM

## 2015-06-06 ENCOUNTER — Ambulatory Visit (HOSPITAL_COMMUNITY)
Admission: RE | Admit: 2015-06-06 | Discharge: 2015-06-06 | Disposition: A | Discharge status: Home / Self Care | Payer: Medicaid Other | Source: Ambulatory Visit | Attending: Family Medicine | Admitting: Family Medicine

## 2015-06-06 DIAGNOSIS — R7989 Other specified abnormal findings of blood chemistry: Secondary | ICD-10-CM

## 2015-06-06 DIAGNOSIS — R946 Abnormal results of thyroid function studies: Secondary | ICD-10-CM | POA: Diagnosis not present

## 2015-07-05 ENCOUNTER — Encounter: Payer: Self-pay | Admitting: "Endocrinology

## 2015-07-05 ENCOUNTER — Ambulatory Visit (INDEPENDENT_AMBULATORY_CARE_PROVIDER_SITE_OTHER): Payer: Medicaid Other | Admitting: "Endocrinology

## 2015-07-05 VITALS — BP 125/84 | HR 83 | Ht 68.0 in | Wt 113.0 lb

## 2015-07-05 DIAGNOSIS — E079 Disorder of thyroid, unspecified: Secondary | ICD-10-CM

## 2015-07-05 LAB — BASIC METABOLIC PANEL
BUN: 11 mg/dL (ref 7–25)
CALCIUM: 8.9 mg/dL (ref 8.6–10.2)
CO2: 24 mmol/L (ref 20–31)
Chloride: 105 mmol/L (ref 98–110)
Creat: 0.58 mg/dL (ref 0.50–1.10)
Glucose, Bld: 65 mg/dL (ref 65–99)
POTASSIUM: 4.3 mmol/L (ref 3.5–5.3)
SODIUM: 140 mmol/L (ref 135–146)

## 2015-07-05 NOTE — Progress Notes (Signed)
Subjective:    Patient ID: Laura Haynes, female    DOB: 01/10/1971, [facility]   Past Medical History  Diagnosis Date  . Sarcoidosis (HCC)     chronic pain medication  . 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 COPD (chronic obstructive pulmonary disease) (HCC)   . Asthma   . Anxiety   . Oxygen dependent     prn basis  . Shortness of breath   . Gastric ulcer with hemorrhage 2013    gastric ulcer surgery   Past Surgical History  Procedure Laterality Date  . Cesarean section  1993  . Lung biopsy  [id number]  . Tonsillectomy  2008  . Back surgery  2008  . Abdominal hysterectomy  2003  . Esophagogastroduodenoscopy  March 2014    Dr. Renae FicklePaul Oh: 1 crater gastric ulcer in the antrum. Esophagus normal. Biopsying benign, no H. pylori.  . Colonoscopy      age 45, [facility]. normal per patient  . Esophagogastroduodenoscopy  January 2014    Dr. Renae FicklePaul Oh: Gastric ulcer, normal esophagus, biopsies benign, no H. pylori  . Esophagogastroduodenoscopy  October 2013    Dr. Renae FicklePaul Oh: Large gastric ulcer with visible vessel injected with epinephrine. No biopsy done.  . Esophagogastroduodenoscopy (egd) with esophageal dilation N/A 04/13/2013    RMR: Extensive ulceration/ scarring/deformity of the antral/prepyloric gastric mucosa as described above-status post bx  . Cataract extraction w/phaco Left 04/17/2014    Procedure: CATARACT EXTRACTION PHACO AND INTRAOCULAR LENS PLACEMENT LEFT EYE CDE=4.91;  Surgeon: Gemma PayorKerry Hunt, MD;  Location: [facility];  Service: Ophthalmology;  Laterality: Left;  . Cataract extraction w/phaco Right 05/25/2014    Procedure: CATARACT EXTRACTION PHACO AND INTRAOCULAR LENS PLACEMENT RIGHT EYE;  Surgeon: Gemma PayorKerry Hunt, MD;  Location: [facility];  Service: Ophthalmology;  Laterality: Right;  CDE:3.09   Social History   Social History  . Marital Status: Married    Spouse Name: N/A  . Number of Children: 1  . Years of  Education: N/A   Occupational History  . disability    Social History Main Topics  . Smoking status: Former Smoker -- 0.25 packs/day    Types: Cigarettes  . Smokeless tobacco: None     Comment: 1/2 pack a day  . Alcohol Use: No  . Drug Use: No  . Sexual Activity: Yes   Other Topics Concern  . None   Social History Narrative   Outpatient Encounter Prescriptions as of 07/05/2015  Medication Sig  . albuterol (PROVENTIL HFA;VENTOLIN HFA) 108 (90 BASE) MCG/ACT inhaler Inhale 2 puffs into the lungs every 6 (six) hours as needed for wheezing or shortness of breath.  Marland Kitchen. albuterol (PROVENTIL) (2.5 MG/3ML) 0.083% nebulizer solution Take 2.5 mg by nebulization every 6 (six) hours as needed for wheezing.  Marland Kitchen. dexlansoprazole (DEXILANT) 60 MG capsule Take 1 capsule (60 mg total) by mouth daily.  . predniSONE (DELTASONE) 20 MG tablet Take 20 mg by mouth daily with breakfast.  . SAPHRIS 5 MG SUBL 24 hr tablet Place 5 mg under the tongue 2 (two) times daily.  . [DISCONTINUED] fentaNYL (DURAGESIC - DOSED MCG/HR) 25 MCG/HR patch Place 25 mcg onto the skin every 3 (three) days.   . [DISCONTINUED] levofloxacin (LEVAQUIN) 750 MG tablet Take 1 tablet (750 mg total) by mouth daily.  . [DISCONTINUED] oxyCODONE-acetaminophen (PERCOCET) 7.5-325 MG per tablet Take 1 tablet by mouth 2 (two) times daily as  needed for pain.  . [DISCONTINUED] polyvinyl alcohol-povidone (HYPOTEARS) 1.4-0.6 % ophthalmic solution Place 1-2 drops into both eyes as needed. Dry eyes  . [DISCONTINUED] predniSONE (DELTASONE) 10 MG tablet Take 10 mg by mouth daily.  . [DISCONTINUED] predniSONE (DELTASONE) 10 MG tablet Take 1 tablet (10 mg total) by mouth daily with breakfast. Take 6 tablets today and then decrease by 1 tablet daily until none are left.  . [DISCONTINUED] promethazine (PHENERGAN) 25 MG tablet Take 25 mg by mouth every 4 (four) hours as needed. FOR NAUSEA AND VOMITING  . [DISCONTINUED] sucralfate (CARAFATE) 1 GM/10ML suspension  Take 10 mLs (1 g total) by mouth 4 (four) times daily -  with meals and at bedtime.   No facility-administered encounter medications on file as of 07/05/2015.   ALLERGIES: Allergies  Allergen Reactions  . Coreg [Carvedilol] Other (See Comments)    Hair loss/mood swings  . Darvocet [Propoxyphene N-Acetaminophen] Other (See Comments)    blisters  . Morphine And Related Other (See Comments)    blisters  . Motrin [Ibuprofen] Other (See Comments)    blisters  . Phenobarbital Other (See Comments)    Child hood allergy   VACCINATION STATUS:  There is no immunization history on file for this patient.  HPI Laura Haynes is a definite patient with above medical history. She is being seen in consultation for abnormal thyroid function tests requested by her PCP at [facility]. She has dealt with quantified weight loss over several months. She denies heat intolerance, cold intolerance, tremors. She complains of fatigue, weight loss. She is heavy chronic smoker with COPD on ongoing prednisone therapy 20 mg by mouth daily. She is on a combination bronchodilators with a history of hypoxia into the 80s today at 96% SPo2.  Per her report she took prednisone for "years ".  Review of Systems   Constitutional: +loss, +fatigue, no subjective hyperthermia/hypothermia Eyes: no blurry vision, no xerophthalmia ENT: no sore throat, no nodules palpated in throat, no dysphagia/odynophagia, no hoarseness Cardiovascular: no CP/SOB/palpitations/leg swelling Respiratory: + cough, +SOB Gastrointestinal: no N/V/D/C Musculoskeletal: no muscle/joint aches Skin: no rashes Neurological: + tremors Psychiatric: no depression/anxiety   Objective:    BP 125/84 mmHg  Pulse 83  Ht 5\' 8"  (1.727 m)  Wt 113 lb (51.256 kg)  BMI 17.19 kg/m2  SpO2 96%  Wt Readings from Last 3 Encounters:  07/05/15 113 lb (51.256 kg)  03/29/15 118 lb 3.2 oz (53.615 kg)  10/25/14 115 lb (52.164 kg)    Physical  Exam Constitutional: light build,  in NAD Eyes: PERRLA, EOMI, no exophthalmos ENT: moist mucous membranes, no thyromegaly, no cervical lymphadenopathy Cardiovascular: RRR, No MRG Respiratory: + diffuse wheezes. Gastrointestinal: abdomen soft, NT, ND, BS+ Musculoskeletal: no deformities, strength intact in all 4 Skin: moist, warm. Neurological:  + tremor with outstretched hands, DTR normal in all 4  CMP ( most recent) CMP     Component Value Date/Time   NA 140 03/29/2015 0556   NA 136 06/16/2013 1715   K 3.8 03/29/2015 0556   K 4.1 06/16/2013 1715   CL 110 03/29/2015 0556   CL 105 06/16/2013 1715   CO2 22 03/29/2015 0556   CO2 27 06/16/2013 1715   GLUCOSE 138* 03/29/2015 0556   GLUCOSE 107* 06/16/2013 1715   BUN 12 03/29/2015 0556   BUN 10 06/16/2013 1715   CREATININE 0.52 03/29/2015 0556   CREATININE 0.53* 06/16/2013 1715   CALCIUM 8.4* 03/29/2015 0556   CALCIUM 9.0 06/16/2013 1715  PROT 6.9 06/14/2014 2145   PROT 7.3 06/16/2013 1715   ALBUMIN 4.0 06/14/2014 2145   ALBUMIN 3.4 06/16/2013 1715   AST 12 06/14/2014 2145   AST 19 06/16/2013 1715   ALT 7 06/14/2014 2145   ALT 10* 06/16/2013 1715   ALKPHOS 68 06/14/2014 2145   ALKPHOS 112 06/16/2013 1715   BILITOT 0.3 06/14/2014 2145   BILITOT 0.2 06/16/2013 1715   GFRNONAA >60 03/29/2015 0556   GFRNONAA >60 06/16/2013 1715   GFRAA >60 03/29/2015 0556   GFRAA >60 06/16/2013 1715   Reported low TSH and low free T4 on November 2016. Review shows that she has no diabetes, CBC within normal limits.    Assessment & Plan:   1. Disorder of thyroid gland -Her actual thyroid function test reports are not available today. I will proceed to repeat full profile thyroid function test on her way home today. She will return in 1 week to discuss findings and decision to treat if necessary.  I had a long discussion with her about how that thyroid works. Most of her symptoms are nonspecific and not necessarily pointing towards   thyroid dysfunction. I will include adrenal evaluation preferably ACTH stimulation test after stopping prednisone for at least 24 hours after her next visit if thyroid function tests are inconclusive. Her fatigue can be explained by her COPD, had a long discussion about smoking cessation. She also has sarcoidosis to complicate the picture. I did not prescribe any new medications today.   - I advised patient to maintain close follow up with [facility] for primary care needs. Follow up plan: Return in about 3 months (around 10/03/2015) for labs today, abnormal thyroid study.  Marquis Lunch, MD Phone: [phone]  Fax: [phone]   07/05/2015, 9:02 AM

## 2015-07-06 LAB — TSH: TSH: 1.901 u[IU]/mL (ref 0.350–4.500)

## 2015-07-06 LAB — THYROGLOBULIN ANTIBODY: Thyroglobulin Ab: <1 IU/mL (ref <2)

## 2015-07-06 LAB — T4, FREE: FREE T4: 0.87 ng/dL (ref 0.80–1.80)

## 2015-07-06 LAB — THYROID PEROXIDASE ANTIBODY: Thyroperoxidase Ab SerPl-aCnc: <1 IU/mL (ref <9)

## 2015-07-06 LAB — CORTISOL-AM, BLOOD: CORTISOL - AM: 1 ug/dL — AB (ref 4.3–22.4)

## 2015-07-14 IMAGING — CR DG CHEST 2V
2 series · 2 of 2 positions shown · non-contrast
Comparison: 06/20/2013 chest radiograph, CT abdomen/pelvis
11/19/2014

CLINICAL DATA: Chest pain for 1 day.  History of sarcoidosis

EXAM:
CHEST  2 VIEW

[view not recorded (1 of 2)]
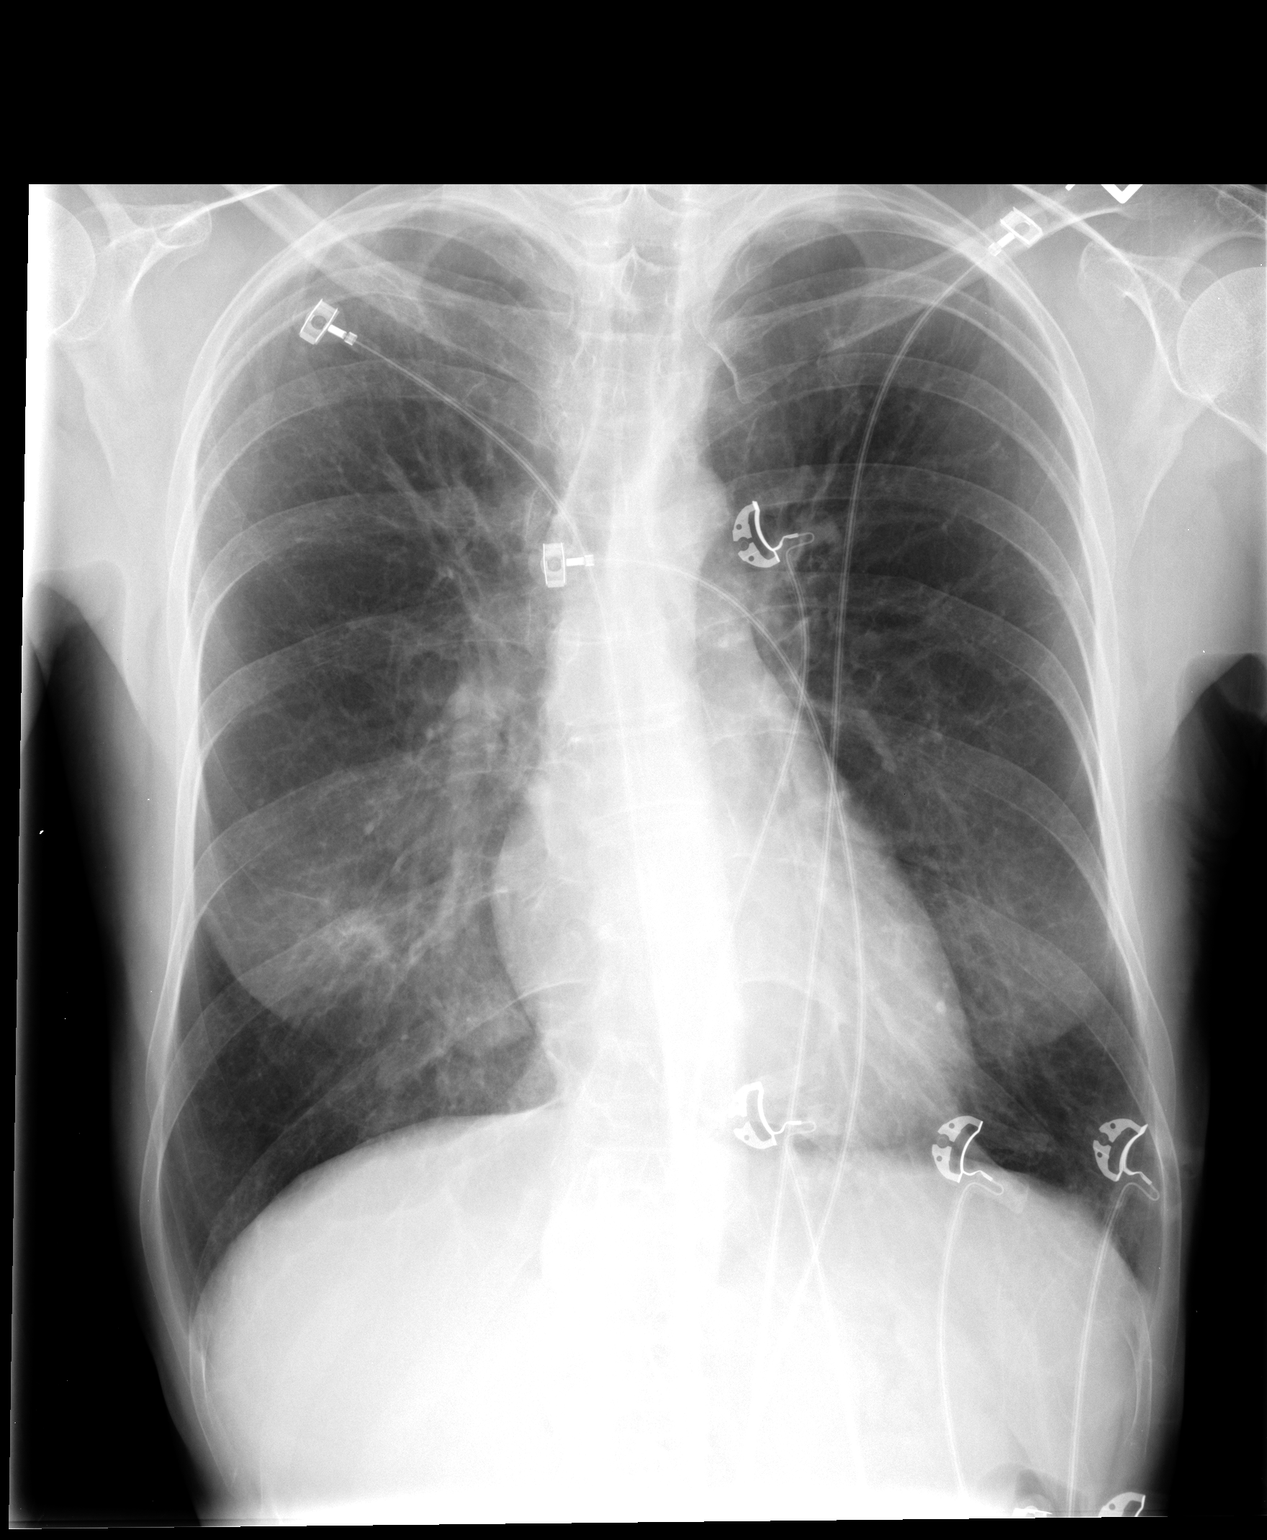

[view not recorded (2 of 2)]
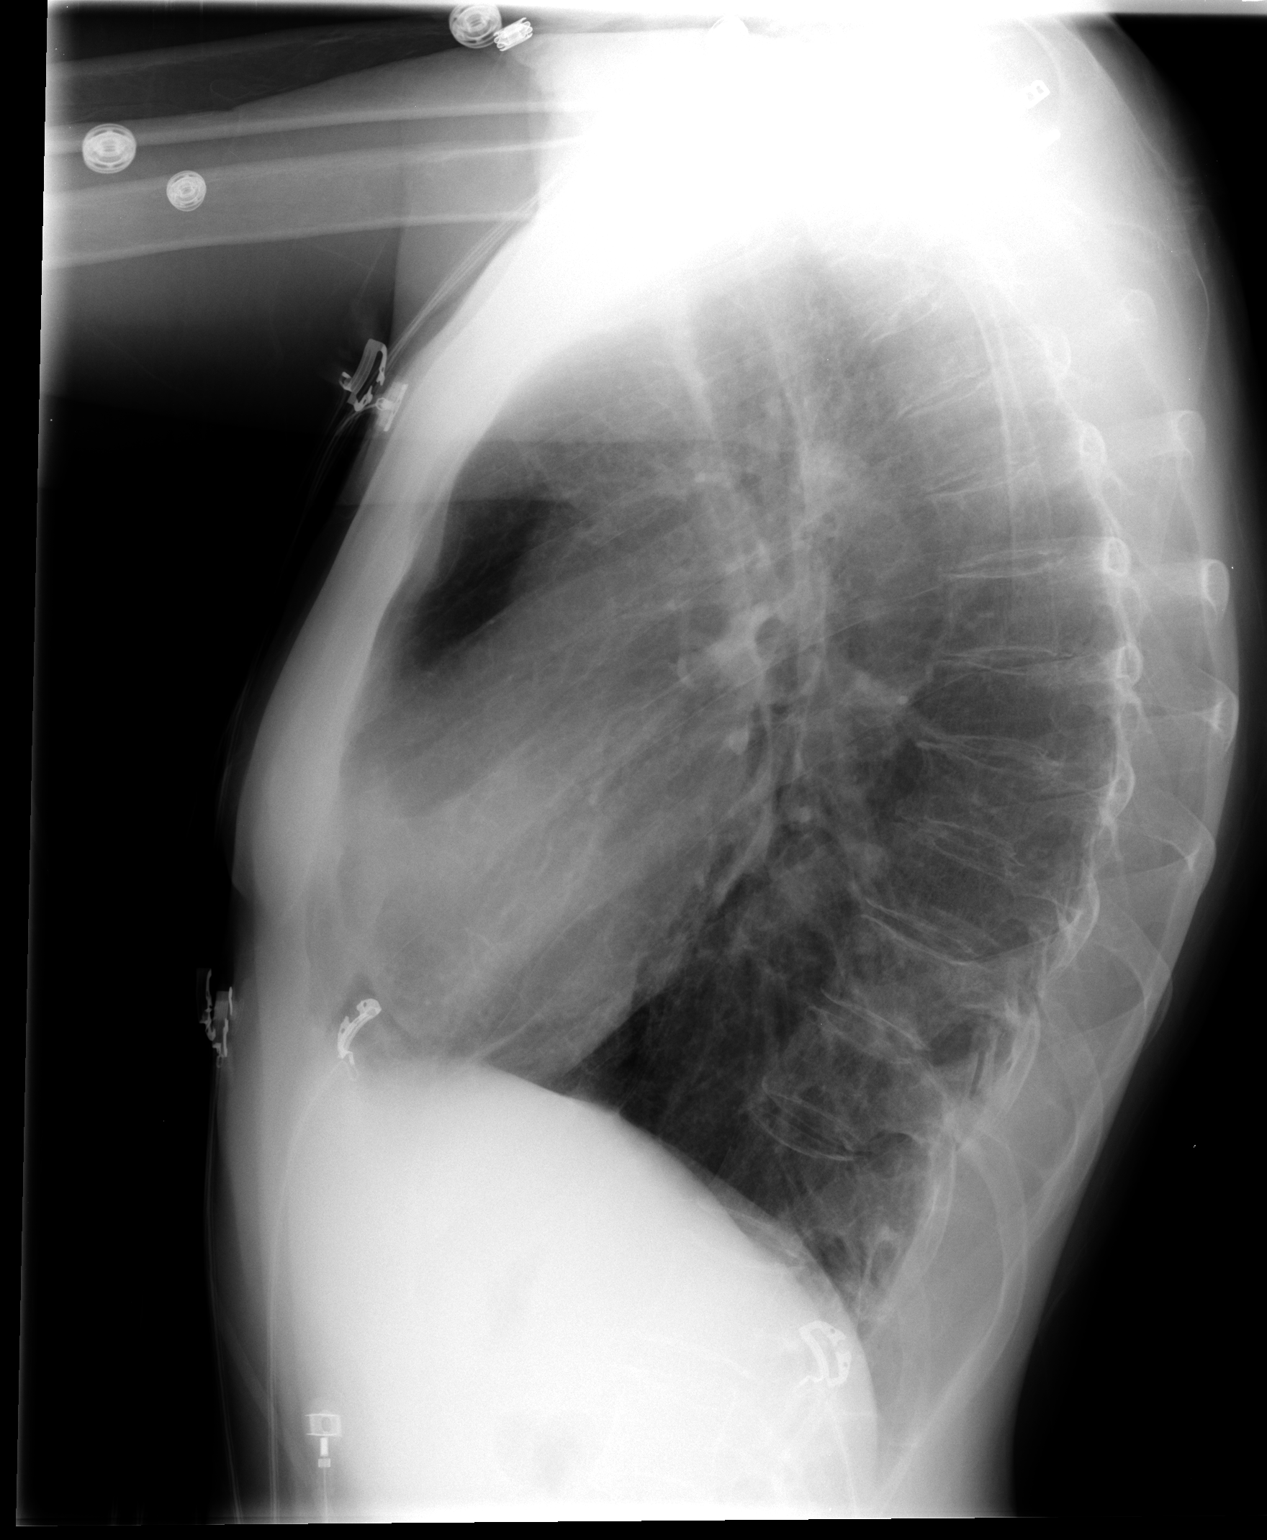

[2 of 2 positions shown; findings below may reference images not displayed]

FINDINGS: Lungs are hyperinflated with biapical scarring reidentified. Right
lower lobe nodularity is subjectively stable since the prior
dissimilar exam 11/18/2013. No pleural effusion. Heart size is
normal. No acute osseous finding.
IMPRESSION: Biapical scarring compatible with the provided history of
sarcoidosis.

Right lower lobe nodularity again noted, without new acute
abnormality.

## 2015-07-18 ENCOUNTER — Ambulatory Visit (INDEPENDENT_AMBULATORY_CARE_PROVIDER_SITE_OTHER): Payer: Medicaid Other | Admitting: "Endocrinology

## 2015-07-18 ENCOUNTER — Encounter: Payer: Self-pay | Admitting: "Endocrinology

## 2015-07-18 VITALS — BP 133/83 | HR 90 | Ht 68.0 in | Wt 116.0 lb

## 2015-07-18 DIAGNOSIS — E162 Hypoglycemia, unspecified: Secondary | ICD-10-CM

## 2015-07-18 DIAGNOSIS — E2740 Unspecified adrenocortical insufficiency: Secondary | ICD-10-CM | POA: Diagnosis not present

## 2015-07-18 MED ORDER — GLUCOSE BLOOD VI STRP: ORAL_STRIP | Status: DC

## 2015-07-18 MED ORDER — ACCU-CHEK AVIVA DEVI: Status: DC

## 2015-07-18 NOTE — Progress Notes (Signed)
Subjective:    Patient ID: Laura Haynes, female    DOB: [birth year], PCP [facility]   Past Medical History  Diagnosis Date  . Sarcoidosis (HCC)     chronic pain medication  . 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 COPD (chronic obstructive pulmonary disease) (HCC)   . Asthma   . Anxiety   . Oxygen dependent     prn basis  . Shortness of breath   . Gastric ulcer with hemorrhage 2013    gastric ulcer surgery   Past Surgical History  Procedure Laterality Date  . Cesarean section  1993  . Lung biopsy  [id number]  . Tonsillectomy  2008  . Back surgery  2008  . Abdominal hysterectomy  2003  . Esophagogastroduodenoscopy  March 2014    Dr. Renae Fickle Oh: 1 crater gastric ulcer in the antrum. Esophagus normal. Biopsying benign, no H. pylori.  . Colonoscopy      age 79, 14. normal per patient  . Esophagogastroduodenoscopy  January 2014    Dr. Renae Fickle Oh: Gastric ulcer, normal esophagus, biopsies benign, no H. pylori  . Esophagogastroduodenoscopy  October 2013    Dr. Renae Fickle Oh: Large gastric ulcer with visible vessel injected with epinephrine. No biopsy done.  . Esophagogastroduodenoscopy (egd) with esophageal dilation N/A 04/13/2013    RMR: Extensive ulceration/ scarring/deformity of the antral/prepyloric gastric mucosa as described above-status post bx  . Cataract extraction w/phaco Left 04/17/2014    Procedure: CATARACT EXTRACTION PHACO AND INTRAOCULAR LENS PLACEMENT LEFT EYE CDE=4.91;  Surgeon: Gemma Payor, MD;  Location: [facility];  Service: Ophthalmology;  Laterality: Left;  . Cataract extraction w/phaco Right 05/25/2014    Procedure: CATARACT EXTRACTION PHACO AND INTRAOCULAR LENS PLACEMENT RIGHT EYE;  Surgeon: Gemma Payor, MD;  Location: [facility];  Service: Ophthalmology;  Laterality: Right;  CDE:3.09   Social History   Social History  . Marital Status: Married    Spouse Name: N/A  . Number of Children: 1  . Years of  Education: N/A   Occupational History  . disability    Social History Main Topics  . Smoking status: Former Smoker -- 0.25 packs/day    Types: Cigarettes  . Smokeless tobacco: None     Comment: 1/2 pack a day  . Alcohol Use: No  . Drug Use: No  . Sexual Activity: Yes   Other Topics Concern  . None   Social History Narrative   Outpatient Encounter Prescriptions as of 07/18/2015  Medication Sig  . albuterol (PROVENTIL HFA;VENTOLIN HFA) 108 (90 BASE) MCG/ACT inhaler Inhale 2 puffs into the lungs every 6 (six) hours as needed for wheezing or shortness of breath.  Marland Kitchen albuterol (PROVENTIL) (2.5 MG/3ML) 0.083% nebulizer solution Take 2.5 mg by nebulization every 6 (six) hours as needed for wheezing.  Marland Kitchen dexlansoprazole (DEXILANT) 60 MG capsule Take 1 capsule (60 mg total) by mouth daily.  . predniSONE (DELTASONE) 20 MG tablet Take 20 mg by mouth daily with breakfast.  . SAPHRIS 5 MG SUBL 24 hr tablet Place 5 mg under the tongue 2 (two) times daily.  . Blood Glucose Monitoring Suppl (ACCU-CHEK AVIVA) device Use as instructed  . glucose blood (ACCU-CHEK AVIVA) test strip Use to test glucose 4 times a day   No facility-administered encounter medications on file as of 07/18/2015.   ALLERGIES: Allergies  Allergen Reactions  . Coreg [Carvedilol] Other (See Comments)    Hair loss/mood  swings  . Darvocet [Propoxyphene N-Acetaminophen] Other (See Comments)    blisters  . Morphine And Related Other (See Comments)    blisters  . Motrin [Ibuprofen] Other (See Comments)    blisters  . Phenobarbital Other (See Comments)    Child hood allergy   VACCINATION STATUS:  There is no immunization history on file for this patient.  HPI Laura Haynes is a female patient with above medical history. She is here to follow-up after repeat labs including thyroid function tests. -She does not have new complaints since last visit. She has dealt with unquantified weight loss over several months. She denies  heat intolerance, cold intolerance, tremors. She complains of fatigue, weight loss. She is heavy chronic smoker with COPD on ongoing prednisone therapy 20 mg by mouth daily, mainly prescribed for sarcoidosis. She is on a combination bronchodilators with a history of hypoxia into the 80s today at 96% SPo2. She has oxygen to use however admits that she is not using it regularly.  Per her report she took prednisone for "years ".  Review of Systems   Constitutional: +loss, +fatigue, no subjective hyperthermia/hypothermia Eyes: no blurry vision, no xerophthalmia ENT: no sore throat, no nodules palpated in throat, no dysphagia/odynophagia, no hoarseness Cardiovascular: no CP/SOB/palpitations/leg swelling Respiratory: + cough, +SOB Gastrointestinal: no N/V/D/C Musculoskeletal: no muscle/joint aches Skin: no rashes Neurological: + tremors Psychiatric: no depression/anxiety   Objective:    BP 133/83 mmHg  Pulse 90  Ht  (1.727 m)  Wt 116 lb (52.617 kg)  BMI 17.64 kg/m2  SpO2 96%  Wt Readings from Last 3 Encounters:  07/18/15 116 lb (52.617 kg)  07/05/15 113 lb (51.256 kg)  03/29/15 118 lb 3.2 oz (53.615 kg)    Physical Exam Constitutional: light build,  in NAD Eyes: PERRLA, EOMI, no exophthalmos ENT: moist mucous membranes, no thyromegaly, no cervical lymphadenopathy Cardiovascular: RRR, No MRG Respiratory: + diffuse wheezes. Gastrointestinal: abdomen soft, NT, ND, BS+ Musculoskeletal: no deformities, strength intact in all 4 Skin: moist, warm. Neurological:  + tremor with outstretched hands, DTR normal in all 4   Recent Results (from the past 2160 hour(s))  Hemoglobin A1c     Status: None   Collection Time: 05/05/15 12:00 AM  Result Value Ref Range   Hemoglobin A1C 5.4   Basic metabolic panel     Status: None   Collection Time: 07/05/15  8:54 AM  Result Value Ref Range   Sodium 140 135 - 146 mmol/L   Potassium 4.3 3.5 - 5.3 mmol/L   Chloride 105 98 - 110 mmol/L    CO2 24 20 - 31 mmol/L   Glucose, Bld 65 65 - 99 mg/dL   BUN 11 7 - 25 mg/dL   Creat 4.09 8.11 - 9.14 mg/dL   Calcium 8.9 8.6 - 78.2 mg/dL  Thyroglobulin antibody     Status: None   Collection Time: 07/05/15  9:12 AM  Result Value Ref Range   Thyroglobulin Ab <1 <2 IU/mL  Thyroid peroxidase antibody     Status: None   Collection Time: 07/05/15  9:12 AM  Result Value Ref Range   Thyroperoxidase Ab SerPl-aCnc <1 <9 IU/mL  TSH     Status: None   Collection Time: 07/05/15  9:12 AM  Result Value Ref Range   TSH 1.901 0.350 - 4.500 uIU/mL  Cortisol-am, blood     Status: Abnormal   Collection Time: 07/05/15  9:12 AM  Result Value Ref Range   Cortisol - AM 1.0 (L)  4.3 - 22.4 ug/dL  T4, free     Status: None   Collection Time: 07/05/15  9:12 AM  Result Value Ref Range   Free T4 0.87 0.80 - 1.80 ng/dL      Assessment & Plan:   1. Adrenal insufficiency: Steroids induced -This is a new diagnosis for her based on a.m. cortisol level of 1 g per DL. -Since she is already on prednisone 20 mg by mouth daily (which is well above replacement dose of approximately 10 mg a day), she will not be sent for ACTH termination test. The ongoing prednisone therapy would interfere with the test. -I advised her to continue 20 mg of prednisone daily for now. This prednisone was prescribed by another provider to suppress sarcoidosis. -After 6-8 weeks, I will attempt to withdraw prednisone for 24 hours and will perform ACTH stimulation test to study her adrenal reserve better.  2.hypoglycemia: There is one documented glucose level of 65 mg per DL on CMP, and patient reports symptoms typical of hypoglycemia at home intermittently. -I gave her blood glucose meter and test strips for her to test blood glucose 4 times a day (before meals and at bedtime) and during times of symptoms before she corrects. She will drop off her logs in 1 week for review. I will determine if she needs any further workup for  hypoglycemia.  3. thyroid function test: This tests are within normal limits for now. She will not need intervention for thyroid at this point.  I had a long discussion with her about how that thyroid works. Most of her symptoms are nonspecific and not necessarily pointing towards  thyroid dysfunction.   4. fatigue: This is chronic problem for her. Her fatigue can be explained by her COPD and associated hypoxia, had a long discussion about smoking cessation. She also has sarcoidosis to complicate the picture. She is advised to be consistent in using her inhalers and follow up with her primary care provider to see if she needs more hours of oxygen supplement at home-she states she has oxygen but does not use it regularly.  - I advised patient to maintain close follow up with [facility] for primary care needs. Follow up plan: Return in about 3 months (around 10/16/2015) for follow up with pre-visit labs.  Marquis Lunch, MD Phone: [phone]  Fax: [phone]   07/18/2015, 9:16 AM

## 2015-07-26 ENCOUNTER — Ambulatory Visit: Payer: Medicaid Other | Admitting: "Endocrinology

## 2015-08-08 ENCOUNTER — Other Ambulatory Visit: Payer: Self-pay | Admitting: Gastroenterology

## 2015-08-09 ENCOUNTER — Encounter: Payer: Self-pay | Admitting: Internal Medicine

## 2015-08-09 NOTE — Telephone Encounter (Signed)
APPT MADE AND LETTER SENT  °

## 2015-08-09 NOTE — Telephone Encounter (Signed)
Patient need f/u ov in 2 months.

## 2015-10-08 ENCOUNTER — Ambulatory Visit: Payer: Medicaid Other | Admitting: Gastroenterology

## 2015-10-18 ENCOUNTER — Ambulatory Visit: Payer: Medicaid Other | Admitting: "Endocrinology

## 2015-11-25 ENCOUNTER — Encounter (HOSPITAL_COMMUNITY): Payer: Self-pay | Admitting: Emergency Medicine

## 2015-11-25 ENCOUNTER — Observation Stay (HOSPITAL_COMMUNITY): Payer: Medicaid Other

## 2015-11-25 ENCOUNTER — Inpatient Hospital Stay (HOSPITAL_COMMUNITY)
Admission: EM | Admit: 2015-11-25 | Discharge: 2015-11-29 | DRG: 190 | Disposition: A | Discharge status: Home / Self Care | Payer: Medicaid Other | Attending: Family Medicine | Admitting: Family Medicine

## 2015-11-25 ENCOUNTER — Emergency Department (HOSPITAL_COMMUNITY): Payer: Medicaid Other

## 2015-11-25 DIAGNOSIS — Z801 Family history of malignant neoplasm of trachea, bronchus and lung: Secondary | ICD-10-CM

## 2015-11-25 DIAGNOSIS — J209 Acute bronchitis, unspecified: Secondary | ICD-10-CM | POA: Diagnosis present

## 2015-11-25 DIAGNOSIS — R079 Chest pain, unspecified: Secondary | ICD-10-CM

## 2015-11-25 DIAGNOSIS — Z9981 Dependence on supplemental oxygen: Secondary | ICD-10-CM

## 2015-11-25 DIAGNOSIS — J440 Chronic obstructive pulmonary disease with acute lower respiratory infection: Principal | ICD-10-CM | POA: Diagnosis present

## 2015-11-25 DIAGNOSIS — Z7952 Long term (current) use of systemic steroids: Secondary | ICD-10-CM

## 2015-11-25 DIAGNOSIS — F419 Anxiety disorder, unspecified: Secondary | ICD-10-CM | POA: Diagnosis present

## 2015-11-25 DIAGNOSIS — R0603 Acute respiratory distress: Secondary | ICD-10-CM

## 2015-11-25 DIAGNOSIS — Z8711 Personal history of peptic ulcer disease: Secondary | ICD-10-CM

## 2015-11-25 DIAGNOSIS — Z90710 Acquired absence of both cervix and uterus: Secondary | ICD-10-CM

## 2015-11-25 DIAGNOSIS — J9621 Acute and chronic respiratory failure with hypoxia: Secondary | ICD-10-CM | POA: Diagnosis present

## 2015-11-25 DIAGNOSIS — D860 Sarcoidosis of lung: Secondary | ICD-10-CM | POA: Diagnosis present

## 2015-11-25 DIAGNOSIS — J189 Pneumonia, unspecified organism: Secondary | ICD-10-CM | POA: Diagnosis present

## 2015-11-25 DIAGNOSIS — Z800 Family history of malignant neoplasm of digestive organs: Secondary | ICD-10-CM

## 2015-11-25 DIAGNOSIS — F17210 Nicotine dependence, cigarettes, uncomplicated: Secondary | ICD-10-CM | POA: Diagnosis present

## 2015-11-25 DIAGNOSIS — Z803 Family history of malignant neoplasm of breast: Secondary | ICD-10-CM

## 2015-11-25 LAB — URINALYSIS, ROUTINE W REFLEX MICROSCOPIC
Bilirubin Urine: NEGATIVE
Glucose, UA: NEGATIVE mg/dL
Ketones, ur: NEGATIVE mg/dL
LEUKOCYTES UA: NEGATIVE
NITRITE: NEGATIVE
PH: 5 (ref 5.0–8.0)
Protein, ur: NEGATIVE mg/dL

## 2015-11-25 LAB — RAPID URINE DRUG SCREEN, HOSP PERFORMED
AMPHETAMINES: NOT DETECTED
BENZODIAZEPINES: NOT DETECTED
Barbiturates: NOT DETECTED
Cocaine: NOT DETECTED
Opiates: NOT DETECTED
TETRAHYDROCANNABINOL: NOT DETECTED

## 2015-11-25 LAB — CBC WITH DIFFERENTIAL/PLATELET
BASOS ABS: 0 10*3/uL (ref 0.0–0.1)
BASOS PCT: 0 %
Eosinophils Absolute: 0.1 10*3/uL (ref 0.0–0.7)
Eosinophils Relative: 0 %
HEMATOCRIT: 35.8 % — AB (ref 36.0–46.0)
HEMOGLOBIN: 11.2 g/dL — AB (ref 12.0–15.0)
LYMPHS PCT: 4 %
Lymphs Abs: 1 10*3/uL (ref 0.7–4.0)
MCH: 28 pg (ref 26.0–34.0)
MCHC: 31.3 g/dL (ref 30.0–36.0)
MCV: 89.5 fL (ref 78.0–100.0)
MONOS PCT: 3 %
Monocytes Absolute: 0.6 10*3/uL (ref 0.1–1.0)
NEUTROS ABS: 22.6 10*3/uL — AB (ref 1.7–7.7)
NEUTROS PCT: 93 %
Platelets: 588 10*3/uL — ABNORMAL HIGH (ref 150–400)
RBC: 4 MIL/uL (ref 3.87–5.11)
RDW: 14.1 % (ref 11.5–15.5)
WBC: 24.3 10*3/uL — ABNORMAL HIGH (ref 4.0–10.5)

## 2015-11-25 LAB — BASIC METABOLIC PANEL
ANION GAP: 8 (ref 5–15)
BUN: 15 mg/dL (ref 6–20)
CHLORIDE: 106 mmol/L (ref 101–111)
CO2: 23 mmol/L (ref 22–32)
Calcium: 8.7 mg/dL — ABNORMAL LOW (ref 8.9–10.3)
Creatinine, Ser: 0.52 mg/dL (ref 0.44–1.00)
GFR calc non Af Amer: >60 mL/min (ref >60)
Glucose, Bld: 112 mg/dL — ABNORMAL HIGH (ref 65–99)
Potassium: 3.6 mmol/L (ref 3.5–5.1)
Sodium: 137 mmol/L (ref 135–145)

## 2015-11-25 LAB — URINE MICROSCOPIC-ADD ON

## 2015-11-25 LAB — TROPONIN I: Troponin I: <0.03 ng/mL (ref <0.031)

## 2015-11-25 LAB — PREGNANCY, URINE: PREG TEST UR: NEGATIVE

## 2015-11-25 MED ORDER — ONDANSETRON HCL 4 MG PO TABS: 4.0000 mg | ORAL_TABLET | Freq: Four times a day (QID) | ORAL | Status: DC | PRN

## 2015-11-25 MED ORDER — SODIUM CHLORIDE 0.9 % IV SOLN
INTRAVENOUS | Status: AC
  Administered 2015-11-26: 06:00:00 via INTRAVENOUS

## 2015-11-25 MED ORDER — AZITHROMYCIN 250 MG PO TABS
500.0000 mg | ORAL_TABLET | ORAL | Status: DC
  Administered 2015-11-26 – 2015-11-29 (×4): 500 mg via ORAL
  Filled 2015-11-25 (×4): qty 2

## 2015-11-25 MED ORDER — METHYLPREDNISOLONE SODIUM SUCC 125 MG IJ SOLR
125.0000 mg | Freq: Once | INTRAMUSCULAR | Status: AC
  Administered 2015-11-25: 125 mg via INTRAVENOUS
  Filled 2015-11-25: qty 2

## 2015-11-25 MED ORDER — ACETAMINOPHEN 325 MG PO TABS: 650.0000 mg | ORAL_TABLET | Freq: Four times a day (QID) | ORAL | Status: DC | PRN

## 2015-11-25 MED ORDER — IPRATROPIUM-ALBUTEROL 0.5-2.5 (3) MG/3ML IN SOLN
3.0000 mL | RESPIRATORY_TRACT | Status: DC
  Administered 2015-11-25 – 2015-11-26 (×8): 3 mL via RESPIRATORY_TRACT
  Filled 2015-11-25 (×8): qty 3

## 2015-11-25 MED ORDER — BUDESONIDE 0.25 MG/2ML IN SUSP
0.2500 mg | Freq: Two times a day (BID) | RESPIRATORY_TRACT | Status: DC
  Administered 2015-11-25 – 2015-11-29 (×8): 0.25 mg via RESPIRATORY_TRACT
  Filled 2015-11-25 (×8): qty 2

## 2015-11-25 MED ORDER — DEXTROSE 5 % IV SOLN
1.0000 g | Freq: Once | INTRAVENOUS | Status: AC
  Administered 2015-11-25: 1 g via INTRAVENOUS
  Filled 2015-11-25: qty 10

## 2015-11-25 MED ORDER — ENOXAPARIN SODIUM 40 MG/0.4ML ~~LOC~~ SOLN
40.0000 mg | SUBCUTANEOUS | Status: DC
  Administered 2015-11-25 – 2015-11-28 (×4): 40 mg via SUBCUTANEOUS
  Filled 2015-11-25 (×4): qty 0.4

## 2015-11-25 MED ORDER — ALBUTEROL SULFATE (2.5 MG/3ML) 0.083% IN NEBU: 2.5000 mg | INHALATION_SOLUTION | RESPIRATORY_TRACT | Status: DC | PRN

## 2015-11-25 MED ORDER — ONDANSETRON HCL 4 MG/2ML IJ SOLN
4.0000 mg | Freq: Four times a day (QID) | INTRAMUSCULAR | Status: DC | PRN
  Administered 2015-11-27 – 2015-11-28 (×4): 4 mg via INTRAVENOUS
  Filled 2015-11-25 (×4): qty 2

## 2015-11-25 MED ORDER — IPRATROPIUM BROMIDE 0.02 % IN SOLN: 0.5000 mg | RESPIRATORY_TRACT | Status: DC

## 2015-11-25 MED ORDER — PANTOPRAZOLE SODIUM 40 MG PO TBEC
40.0000 mg | DELAYED_RELEASE_TABLET | Freq: Every day | ORAL | Status: DC
  Administered 2015-11-26 – 2015-11-29 (×4): 40 mg via ORAL
  Filled 2015-11-25 (×4): qty 1

## 2015-11-25 MED ORDER — ACETAMINOPHEN 650 MG RE SUPP: 650.0000 mg | Freq: Four times a day (QID) | RECTAL | Status: DC | PRN

## 2015-11-25 MED ORDER — METHYLPREDNISOLONE SODIUM SUCC 40 MG IJ SOLR
40.0000 mg | Freq: Every day | INTRAMUSCULAR | Status: DC
  Administered 2015-11-26 – 2015-11-29 (×4): 40 mg via INTRAVENOUS
  Filled 2015-11-25 (×4): qty 1

## 2015-11-25 MED ORDER — IOPAMIDOL (ISOVUE-370) INJECTION 76%
100.0000 mL | Freq: Once | INTRAVENOUS | Status: AC | PRN
Start: 2015-11-25 — End: 2015-11-25
  Administered 2015-11-25: 100 mL via INTRAVENOUS

## 2015-11-25 MED ORDER — ALBUTEROL (5 MG/ML) CONTINUOUS INHALATION SOLN
10.0000 mg/h | INHALATION_SOLUTION | RESPIRATORY_TRACT | Status: AC
  Administered 2015-11-25: 10 mg/h via RESPIRATORY_TRACT
  Filled 2015-11-25: qty 20

## 2015-11-25 MED ORDER — DEXTROSE 5 % IV SOLN
1.0000 g | INTRAVENOUS | Status: DC
  Administered 2015-11-26 – 2015-11-28 (×3): 1 g via INTRAVENOUS
  Filled 2015-11-25 (×4): qty 10

## 2015-11-25 MED ORDER — AZITHROMYCIN 250 MG PO TABS
500.0000 mg | ORAL_TABLET | Freq: Once | ORAL | Status: AC
  Administered 2015-11-25: 500 mg via ORAL
  Filled 2015-11-25 (×2): qty 2

## 2015-11-25 MED ORDER — ALBUTEROL SULFATE (2.5 MG/3ML) 0.083% IN NEBU: 2.5000 mg | INHALATION_SOLUTION | RESPIRATORY_TRACT | Status: DC

## 2015-11-25 MED ORDER — ASENAPINE MALEATE 5 MG SL SUBL
5.0000 mg | SUBLINGUAL_TABLET | Freq: Two times a day (BID) | SUBLINGUAL | Status: DC
  Filled 2015-11-25 (×6): qty 1

## 2015-11-25 MED ORDER — HYDROCORTISONE NA SUCCINATE PF 100 MG IJ SOLR: 100.0000 mg | INTRAMUSCULAR | Status: DC

## 2015-11-25 MED ORDER — MAGNESIUM SULFATE 2 GM/50ML IV SOLN
2.0000 g | INTRAVENOUS | Status: AC
  Administered 2015-11-25: 2 g via INTRAVENOUS
  Filled 2015-11-25: qty 50

## 2015-11-25 NOTE — ED Provider Notes (Addendum)
CSN: [id number]     Arrival date & time 11/25/15  1046 History  By signing my name below, I, Tanda Rockers, attest that this documentation has been prepared under the direction and in the presence of Eber Hong, MD. Electronically Signed: Tanda Rockers, ED Scribe. 11/25/2015. 11:05 AM.   Chief Complaint  Patient presents with  . Shortness of Breath   The history is provided by the patient. No language interpreter was used.    HPI Comments: AINA ROSSBACH is a 45 y.o. female with PMHx Sarcoidosis, COPD, and Asthma, who presents to the Emergency Department complaining of gradual onset, constant, shortness of breath x 4 days. Pt also complains of a productive cough, fever, and chills. Pt has been taking Tylenol with some relief. Her temperature in the ED is 98.2. She has been using her nebulizer machine but reports that her child broke the mouthpiece to the machine last night and has not used it since. She only uses the machine as needed with cold like symptoms.Denies sore throat, nasal congestion, nausea, vomiting, diarrhea, leg swelling, dysuria, or any other associated symptoms. No recent insect bite or rashes. No recent prolonged travel.   PCP - [facility] Pulmonologist - Dr. Ramond Dial   Past Medical History  Diagnosis Date  . Sarcoidosis (HCC)     chronic pain medication  . 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 COPD (chronic obstructive pulmonary disease) (HCC)   . Asthma   . Anxiety   . Oxygen dependent     prn basis  . Shortness of breath   . Gastric ulcer with hemorrhage 2013    gastric ulcer surgery   Past Surgical History  Procedure Laterality Date  . Cesarean section  1993  . Lung biopsy  [id number]  . Tonsillectomy  2008  . Back surgery  2008  . Abdominal hysterectomy  2003  . Esophagogastroduodenoscopy  March 2014    Dr. Renae Fickle Oh: 1 crater gastric ulcer in the antrum. Esophagus normal. Biopsying benign, no H. pylori.  .  Colonoscopy      age 73, 3. normal per patient  . Esophagogastroduodenoscopy  January 2014    Dr. Renae Fickle Oh: Gastric ulcer, normal esophagus, biopsies benign, no H. pylori  . Esophagogastroduodenoscopy  October 2013    Dr. Renae Fickle Oh: Large gastric ulcer with visible vessel injected with epinephrine. No biopsy done.  . Esophagogastroduodenoscopy (egd) with esophageal dilation N/A 04/13/2013    RMR: Extensive ulceration/ scarring/deformity of the antral/prepyloric gastric mucosa as described above-status post bx  . Cataract extraction w/phaco Left 04/17/2014    Procedure: CATARACT EXTRACTION PHACO AND INTRAOCULAR LENS PLACEMENT LEFT EYE CDE=4.91;  Surgeon: Gemma Payor, MD;  Location: [facility];  Service: Ophthalmology;  Laterality: Left;  . Cataract extraction w/phaco Right 05/25/2014    Procedure: CATARACT EXTRACTION PHACO AND INTRAOCULAR LENS PLACEMENT RIGHT EYE;  Surgeon: Gemma Payor, MD;  Location: [facility];  Service: Ophthalmology;  Laterality: Right;  CDE:3.09   Family History  Problem Relation Age of Onset  . Colon cancer Maternal Aunt     greater than age 25  . Liver disease Neg Hx   . Breast cancer Mother   . Lung cancer Father    Social History  Substance Use Topics  . Smoking status: Current Every Day Smoker -- 0.50 packs/day    Types: Cigarettes  . Smokeless tobacco: None     Comment: 1/2 pack a day  . Alcohol  Use: No   OB History    No data available     Review of Systems  Constitutional: Positive for fever and chills.  HENT: Negative for congestion and sore throat.   Respiratory: Positive for cough and shortness of breath.   Cardiovascular: Negative for leg swelling.  Gastrointestinal: Negative for nausea, vomiting and diarrhea.  Genitourinary: Negative for dysuria.  Skin: Negative for rash.  All other systems reviewed and are negative.   Allergies  Coreg; Darvocet; Morphine and related; Motrin; and Phenobarbital  Home Medications   Prior to Admission  medications   Medication Sig Start Date End Date Taking? Authorizing Provider  albuterol (PROVENTIL HFA;VENTOLIN HFA) 108 (90 BASE) MCG/ACT inhaler Inhale 2 puffs into the lungs every 6 (six) hours as needed for wheezing or shortness of breath.   Yes Historical Provider, MD  albuterol (PROVENTIL) (2.5 MG/3ML) 0.083% nebulizer solution Take 2.5 mg by nebulization every 6 (six) hours as needed for wheezing.   Yes Historical Provider, MD  dexlansoprazole (DEXILANT) 60 MG capsule Take 1 capsule (60 mg total) by mouth daily. 10/18/13  Yes Tiffany Kocher, PA-C  predniSONE (DELTASONE) 20 MG tablet Take 20 mg by mouth daily with breakfast.   Yes Historical Provider, MD  SAPHRIS 5 MG SUBL 24 hr tablet Place 5 mg under the tongue 2 (two) times daily. 06/08/14  Yes Historical Provider, MD  glucose blood (ACCU-CHEK AVIVA) test strip Use to test glucose 4 times a day Patient not taking: Reported on 11/25/2015 07/18/15   Roma Kayser, MD  LINZESS 290 MCG CAPS capsule TAKE ONE (1) CAPSULE EACH DAY Patient not taking: Reported on 11/25/2015 08/09/15   Tiffany Kocher, PA-C   BP 123/72 mmHg  Pulse 106  Temp(Src) 98.2 F (36.8 C) (Oral)  Resp 14  Ht  (1.702 m)  Wt 118 lb (53.524 kg)  BMI 18.48 kg/m2  SpO2 95%   Physical Exam  Constitutional: She appears well-developed and well-nourished. No distress.  HENT:  Head: Normocephalic and atraumatic.  Mouth/Throat: Oropharynx is clear and moist. No oropharyngeal exudate.  Eyes: Conjunctivae and EOM are normal. Pupils are equal, round, and reactive to light. Right eye exhibits no discharge. Left eye exhibits no discharge. No scleral icterus.  Neck: Normal range of motion. Neck supple. No JVD present. No thyromegaly present.  Cardiovascular: Normal rate, regular rhythm, normal heart sounds and intact distal pulses.  Exam reveals no gallop and no friction rub.   No murmur heard. Pulmonary/Chest: No respiratory distress. She has wheezes. She has rhonchi. She  has no rales.  Inspiratory and expiratory wheezing with rhonchi diffusely. Increased work of breath.  Speaks in shortened sentences.   Abdominal: Soft. Bowel sounds are normal. She exhibits no distension and no mass. There is no tenderness.  Musculoskeletal: Normal range of motion. She exhibits no edema or tenderness.  Lymphadenopathy:    She has no cervical adenopathy.  Neurological: She is alert. Coordination normal.  Skin: Skin is warm and dry. No rash noted. No erythema.  Psychiatric: She has a normal mood and affect. Her behavior is normal.  Nursing note and vitals reviewed.   ED Course  Procedures (including critical care time)  DIAGNOSTIC STUDIES: Oxygen Saturation is 93% on RA, adequate by my interpretation.    COORDINATION OF CARE: 11:00 AM-Discussed treatment plan with pt at bedside and pt agreed to plan.   Labs Review Labs Reviewed  CBC WITH DIFFERENTIAL/PLATELET - Abnormal; Notable for the following:    WBC 24.3 (*)  Hemoglobin 11.2 (*)    HCT 35.8 (*)    Platelets 588 (*)    Neutro Abs 22.6 (*)    All other components within normal limits  BASIC METABOLIC PANEL - Abnormal; Notable for the following:    Glucose, Bld 112 (*)    Calcium 8.7 (*)    All other components within normal limits  URINALYSIS, ROUTINE W REFLEX MICROSCOPIC (NOT AT [facility]) - Abnormal; Notable for the following:    Specific Gravity, Urine <1.005 (*)    Hgb urine dipstick SMALL (*)    All other components within normal limits  URINE MICROSCOPIC-ADD ON - Abnormal; Notable for the following:    Squamous Epithelial / LPF 0-5 (*)    Bacteria, UA RARE (*)    All other components within normal limits  URINE CULTURE  I-STAT CG4 LACTIC ACID, ED    Imaging Review Dg Chest 2 View  11/25/2015  CLINICAL DATA:  Shortness of breath and wheezing 4 days. History of asthma, COPD and sarcoid. EXAM: CHEST  2 VIEW COMPARISON:  01/26/2015 and prior radiographs FINDINGS: The cardiomediastinal silhouette is  unremarkable. Mild patchy right lower lung opacities may represent infection or sarcoid changes Mild interstitial prominence bilaterally again noted. There is no evidence of pleural effusion or pneumothorax. Mild biapical scarring again noted. No acute bony abnormality identified. IMPRESSION: Mild patchy right lower lung opacities which may represent infection or sarcoid changes Mild chronic interstitial prominence. Electronically Signed   By: Harmon Pier M.D.   On: 11/25/2015 12:59   I have personally reviewed and evaluated these images and lab results as part of my medical decision-making.    MDM   Final diagnoses:  CAP (community acquired pneumonia)  Respiratory distress    On repeat exam the patient has ongoing tachycardia, ongoing wheezing, she is still very dyspneic especially when she walks. Her chest x-ray does reveal a right lower lobe infiltrate consistent with a pneumonia, her significant leukocytosis with the presence of a pneumonia and hypoxia with tachycardia suggests that she does have a severe infection. She is not hypotensive. She is requiring supplemental oxygen. She has been out of her home oxygen as well as her home albuterol treatments and after getting treatments here states that she is only minimally better. She will be given magnesium in addition to the other medications. She will need to be admitted to the hospital for her respiratory distress. The patient does appear critically ill.  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  D/w Dr. Toniann Fail - I appreciate his timely consultation and assistance with admission - holding orders requested for telemetry.  Dr. Toniann Fail in agreement.  Medications  albuterol (PROVENTIL,VENTOLIN) solution continuous neb (0 mg/hr Nebulization Stopped 11/25/15 1447)  magnesium sulfate IVPB 2 g 50 mL (2 g Intravenous New  Bag/Given 11/25/15 1441)  hydrocortisone sodium succinate (SOLU-CORTEF) 100 MG injection 100 mg (not administered)  methylPREDNISolone sodium succinate (SOLU-MEDROL) 125 mg/2 mL injection 125 mg (125 mg Intravenous Given 11/25/15 1149)  cefTRIAXone (ROCEPHIN) 1 g in dextrose 5 % 50 mL IVPB (1 g Intravenous New Bag/Given 11/25/15 1441)  azithromycin (ZITHROMAX) tablet 500 mg (500 mg Oral Given 11/25/15 1446)    CRITICAL CARE Performed by: Vida Roller Total critical care time: 35 minutes Critical care time was exclusive of separately billable procedures and treating other patients. Critical care was necessary to treat or prevent  imminent or life-threatening deterioration. Critical care was time spent personally by me on the following activities: development of treatment plan with patient and/or surrogate as well as nursing, discussions with consultants, evaluation of patient's response to treatment, examination of patient, obtaining history from patient or surrogate, ordering and performing treatments and interventions, ordering and review of laboratory studies, ordering and review of radiographic studies, pulse oximetry and re-evaluation of patient's condition.  I personally performed the services described in this documentation, which was scribed in my presence. The recorded information has been reviewed and is accurate.      Eber HongBrian Bryant Lipps, MD 11/25/15 1535  Eber HongBrian Erica Richwine, MD 11/25/15 1537

## 2015-11-25 NOTE — ED Notes (Signed)
Pt states she has been having trouble breathing for the past 4 days, but last night she broke her nebulizer and has not had any treatments.  States she is also out of her home oxygen.  Reports cough.

## 2015-11-25 NOTE — H&P (Signed)
History and Physical    Laura Haynes:[id number] DOB: 28-Nov-1970 DOA: 11/25/2015  PCP: [facility]  Patient coming from: Home.  Chief Complaint: Shortness of breath.  HPI: Laura Haynes is a 45 y.o. female with medical history significant of sarcoidosis on chronic steroid therapy presents to the ER because of shortness of breath. Patient states over the last 3-4 days patient has been having increasing shortness of breath on exertion and sometimes at rest. Patient also has been having pleuritic type of chest pain with cough and is unable to bring in any phlegm out. Denies any fever chills nausea vomiting abdominal pain or diarrhea. Chest x-ray in the ER shows infiltrates concerning for pneumonia with labs showing significant leukocytosis. On exam patient also has bilateral expiratory wheeze. Patient has been admitted for pneumonia and acute bronchitis.   ED Course: Patient was started on antibiotics and steroids along with nebulizer.  Review of Systems: As per HPI, rest all negative.   Past Medical History  Diagnosis Date  . Sarcoidosis (HCC)     chronic pain medication  . 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 COPD (chronic obstructive pulmonary disease) (HCC)   . Asthma   . Anxiety   . Oxygen dependent     prn basis  . Shortness of breath   . Gastric ulcer with hemorrhage 2013    gastric ulcer surgery    Past Surgical History  Procedure Laterality Date  . Cesarean section  1993  . Lung biopsy  [id number]  . Tonsillectomy  2008  . Back surgery  2008  . Abdominal hysterectomy  2003  . Esophagogastroduodenoscopy  March 2014    Dr. Renae Fickle Oh: 1 crater gastric ulcer in the antrum. Esophagus normal. Biopsying benign, no H. pylori.  . Colonoscopy      age 49, 3. normal per patient  . Esophagogastroduodenoscopy  January 2014    Dr. Renae Fickle Oh: Gastric ulcer, normal esophagus, biopsies benign, no H. pylori  .  Esophagogastroduodenoscopy  October 2013    Dr. Renae Fickle Oh: Large gastric ulcer with visible vessel injected with epinephrine. No biopsy done.  . Esophagogastroduodenoscopy (egd) with esophageal dilation N/A 04/13/2013    RMR: Extensive ulceration/ scarring/deformity of the antral/prepyloric gastric mucosa as described above-status post bx  . Cataract extraction w/phaco Left 04/17/2014    Procedure: CATARACT EXTRACTION PHACO AND INTRAOCULAR LENS PLACEMENT LEFT EYE CDE=4.91;  Surgeon: Gemma Payor, MD;  Location: [facility];  Service: Ophthalmology;  Laterality: Left;  . Cataract extraction w/phaco Right 05/25/2014    Procedure: CATARACT EXTRACTION PHACO AND INTRAOCULAR LENS PLACEMENT RIGHT EYE;  Surgeon: Gemma Payor, MD;  Location: [facility];  Service: Ophthalmology;  Laterality: Right;  CDE:3.09     reports that she has been smoking Cigarettes.  She has been smoking about 0.50 packs per day. She does not have any smokeless tobacco history on file. She reports that she does not drink alcohol or use illicit drugs.  Allergies  Allergen Reactions  . Coreg [Carvedilol] Other (See Comments)    Hair loss/mood swings  . Darvocet [Propoxyphene N-Acetaminophen] Other (See Comments)    blisters  . Morphine And Related Other (See Comments)    blisters  . Motrin [Ibuprofen] Other (See Comments)    blisters  . Phenobarbital Other (See Comments)    Child hood allergy    Family History  Problem Relation Age of Onset  . Colon cancer  Maternal Aunt     greater than age 5  . Liver disease Neg Hx   . Breast cancer Mother   . Lung cancer Father     Prior to Admission medications   Medication Sig Start Date End Date Taking? Authorizing Provider  albuterol (PROVENTIL HFA;VENTOLIN HFA) 108 (90 BASE) MCG/ACT inhaler Inhale 2 puffs into the lungs every 6 (six) hours as needed for wheezing or shortness of breath.   Yes Historical Provider, MD  albuterol (PROVENTIL) (2.5 MG/3ML) 0.083% nebulizer solution Take 2.5 mg by  nebulization every 6 (six) hours as needed for wheezing.   Yes Historical Provider, MD  dexlansoprazole (DEXILANT) 60 MG capsule Take 1 capsule (60 mg total) by mouth daily. 10/18/13  Yes Tiffany Kocher, PA-C  predniSONE (DELTASONE) 20 MG tablet Take 20 mg by mouth daily with breakfast.   Yes Historical Provider, MD  SAPHRIS 5 MG SUBL 24 hr tablet Place 5 mg under the tongue 2 (two) times daily. 06/08/14  Yes Historical Provider, MD  glucose blood (ACCU-CHEK AVIVA) test strip Use to test glucose 4 times a day Patient not taking: Reported on 11/25/2015 07/18/15   Roma Kayser, MD  LINZESS 290 MCG CAPS capsule TAKE ONE (1) CAPSULE EACH DAY Patient not taking: Reported on 11/25/2015 08/09/15   Tiffany Kocher, PA-C    Physical Exam: Filed Vitals:   11/25/15 1425 11/25/15 1430 11/25/15 1500 11/25/15 1530  BP: 123/72 132/81 122/69 124/77  Pulse: 106 109 110 102  Temp:      TempSrc:      Resp: Height:      Weight:      SpO2: 95% 93% 91% 91%      Constitutional: Not in distress. Filed Vitals:   11/25/15 1425 11/25/15 1430 11/25/15 1500 11/25/15 1530  BP: 123/72 132/81 122/69 124/77  Pulse: 106 109 110 102  Temp:      TempSrc:      Resp: Height:      Weight:      SpO2: 95% 93% 91% 91%   Eyes: Anicteric no pallor. ENMT: No discharge from the ears eyes nose amount. Neck: No JVD appreciated. No mass felt. Respiratory: Bilateral expiratory wheeze and no crepitations. Cardiovascular: S1 and S2 heard. Abdomen: Soft nontender bowel sounds present. Musculoskeletal: No edema. Skin: No rash. Neurologic: Alert awake oriented to time place and person. Moves all extremities. Psychiatric: Appears normal.   Labs on Admission: I have personally reviewed following labs and imaging studies  CBC:  Recent Labs Lab 11/25/15 1115  WBC 24.3*  NEUTROABS 22.6*  HGB 11.2*  HCT 35.8*  MCV 89.5  PLT 588*   Basic Metabolic Panel:  Recent Labs Lab 11/25/15 1115    NA 137  K 3.6  CL 106  CO2 23  GLUCOSE 112*  BUN 15  CREATININE 0.52  CALCIUM 8.7*   GFR: Estimated Creatinine Clearance: 74.2 mL/min (by C-G formula based on Cr of 0.52). Liver Function Tests: No results for input(s): AST, ALT, ALKPHOS, BILITOT, PROT, ALBUMIN in the last 168 hours. No results for input(s): LIPASE, AMYLASE in the last 168 hours. No results for input(s): AMMONIA in the last 168 hours. Coagulation Profile: No results for input(s): INR, PROTIME in the last 168 hours. Cardiac Enzymes: No results for input(s): CKTOTAL, CKMB, CKMBINDEX, TROPONINI in the last 168 hours. BNP (last 3 results) No results for input(s): PROBNP in the last 8760 hours. HbA1C: No results  for input(s): HGBA1C in the last 72 hours. CBG: No results for input(s): GLUCAP in the last 168 hours. Lipid Profile: No results for input(s): CHOL, HDL, LDLCALC, TRIG, CHOLHDL, LDLDIRECT in the last 72 hours. Thyroid Function Tests: No results for input(s): TSH, T4TOTAL, FREET4, T3FREE, THYROIDAB in the last 72 hours. Anemia Panel: No results for input(s): VITAMINB12, FOLATE, FERRITIN, TIBC, IRON, RETICCTPCT in the last 72 hours. Urine analysis:    Component Value Date/Time   COLORURINE YELLOW 11/25/2015 1445   APPEARANCEUR CLEAR 11/25/2015 1445   LABSPEC <1.005* 11/25/2015 1445   PHURINE 5.0 11/25/2015 1445   GLUCOSEU NEGATIVE 11/25/2015 1445   HGBUR SMALL* 11/25/2015 1445   BILIRUBINUR NEGATIVE 11/25/2015 1445   KETONESUR NEGATIVE 11/25/2015 1445   PROTEINUR NEGATIVE 11/25/2015 1445   UROBILINOGEN 0.2 11/18/2013 2110   NITRITE NEGATIVE 11/25/2015 1445   LEUKOCYTESUR NEGATIVE 11/25/2015 1445   Sepsis Labs: @LABRCNTIP (procalcitonin:4,lacticidven:4) )No results found for this or any previous visit (from the past 240 hour(s)).   Radiological Exams on Admission: Dg Chest 2 View  11/25/2015  CLINICAL DATA:  Shortness of breath and wheezing 4 days. History of asthma, COPD and sarcoid. EXAM: CHEST   2 VIEW COMPARISON:  01/26/2015 and prior radiographs FINDINGS: The cardiomediastinal silhouette is unremarkable. Mild patchy right lower lung opacities may represent infection or sarcoid changes Mild interstitial prominence bilaterally again noted. There is no evidence of pleural effusion or pneumothorax. Mild biapical scarring again noted. No acute bony abnormality identified. IMPRESSION: Mild patchy right lower lung opacities which may represent infection or sarcoid changes Mild chronic interstitial prominence. Electronically Signed   By: Harmon PierJeffrey  Hu M.D.   On: 11/25/2015 12:59     Assessment/Plan Principal Problem:   CAP (community acquired pneumonia) Active Problems:   Acute bronchitis   Sarcoidosis of lung (HCC)   Pneumonia    #1. Community-acquired pneumonia - given the pleuritic type of chest pain leukocytosis and chest x-ray infiltrates we will treat this as community acquired pneumonia with ceftriaxone and Zithromax. Check urine for Legionella and strep antigen and HIV status. Since patient has significant pleuritic type of chest pain we will check CT and exam of the chest. Cycle cardiac markers. #2. Acute bronchitis - patient has been placed on Solu-Medrol antibiotics nebulizer and Pulmicort. #3. Sarcoidosis - patient is on chronic steroid therapy present and IV steroids for bronchitis. #4. Tobacco abuse - patient advised to quit smoking.   DVT prophylaxis: Lovenox. Code Status: Full code.  Family Communication: Patient's husband.  Disposition Plan: Home.  Consults called: None.  Admission status: Observation. Telemetry.    Eduard ClosKAKRAKANDY,Ayisha Pol N. MD [facility] Pager [phone].  If 7PM-7AM, please contact night-coverage [email or web address] Password [email or web address]  11/25/2015, 4:31 PM

## 2015-11-26 DIAGNOSIS — D860 Sarcoidosis of lung: Secondary | ICD-10-CM | POA: Diagnosis not present

## 2015-11-26 DIAGNOSIS — J189 Pneumonia, unspecified organism: Secondary | ICD-10-CM | POA: Diagnosis not present

## 2015-11-26 DIAGNOSIS — Z800 Family history of malignant neoplasm of digestive organs: Secondary | ICD-10-CM | POA: Diagnosis not present

## 2015-11-26 DIAGNOSIS — Z9981 Dependence on supplemental oxygen: Secondary | ICD-10-CM | POA: Diagnosis not present

## 2015-11-26 DIAGNOSIS — Z90710 Acquired absence of both cervix and uterus: Secondary | ICD-10-CM | POA: Diagnosis not present

## 2015-11-26 DIAGNOSIS — Z7952 Long term (current) use of systemic steroids: Secondary | ICD-10-CM | POA: Diagnosis not present

## 2015-11-26 DIAGNOSIS — R0602 Shortness of breath: Secondary | ICD-10-CM | POA: Diagnosis present

## 2015-11-26 DIAGNOSIS — F419 Anxiety disorder, unspecified: Secondary | ICD-10-CM | POA: Diagnosis present

## 2015-11-26 DIAGNOSIS — J440 Chronic obstructive pulmonary disease with acute lower respiratory infection: Secondary | ICD-10-CM | POA: Diagnosis present

## 2015-11-26 DIAGNOSIS — Z8711 Personal history of peptic ulcer disease: Secondary | ICD-10-CM | POA: Diagnosis not present

## 2015-11-26 DIAGNOSIS — Z801 Family history of malignant neoplasm of trachea, bronchus and lung: Secondary | ICD-10-CM | POA: Diagnosis not present

## 2015-11-26 DIAGNOSIS — F17210 Nicotine dependence, cigarettes, uncomplicated: Secondary | ICD-10-CM | POA: Diagnosis present

## 2015-11-26 DIAGNOSIS — J209 Acute bronchitis, unspecified: Secondary | ICD-10-CM | POA: Diagnosis present

## 2015-11-26 DIAGNOSIS — Z803 Family history of malignant neoplasm of breast: Secondary | ICD-10-CM | POA: Diagnosis not present

## 2015-11-26 DIAGNOSIS — J9621 Acute and chronic respiratory failure with hypoxia: Secondary | ICD-10-CM | POA: Diagnosis present

## 2015-11-26 LAB — CBC WITH DIFFERENTIAL/PLATELET
BASOS PCT: 0 %
Basophils Absolute: 0 10*3/uL (ref 0.0–0.1)
EOS ABS: 0 10*3/uL (ref 0.0–0.7)
Eosinophils Relative: 0 %
HEMATOCRIT: 35.5 % — AB (ref 36.0–46.0)
Hemoglobin: 10.8 g/dL — ABNORMAL LOW (ref 12.0–15.0)
LYMPHS PCT: 8 %
Lymphs Abs: 2.1 10*3/uL (ref 0.7–4.0)
MCH: 27.7 pg (ref 26.0–34.0)
MCHC: 30.4 g/dL (ref 30.0–36.0)
MCV: 91 fL (ref 78.0–100.0)
MONO ABS: 1.4 10*3/uL — AB (ref 0.1–1.0)
Monocytes Relative: 5 %
NEUTROS ABS: 23.9 10*3/uL — AB (ref 1.7–7.7)
NEUTROS PCT: 87 %
Platelets: 598 10*3/uL — ABNORMAL HIGH (ref 150–400)
RBC: 3.9 MIL/uL (ref 3.87–5.11)
RDW: 14.5 % (ref 11.5–15.5)
WBC: 27.4 10*3/uL — AB (ref 4.0–10.5)

## 2015-11-26 LAB — COMPREHENSIVE METABOLIC PANEL
ALK PHOS: 73 U/L (ref 38–126)
ALT: 10 U/L — ABNORMAL LOW (ref 14–54)
ANION GAP: 9 (ref 5–15)
AST: 21 U/L (ref 15–41)
Albumin: 3.4 g/dL — ABNORMAL LOW (ref 3.5–5.0)
BILIRUBIN TOTAL: 0.2 mg/dL — AB (ref 0.3–1.2)
BUN: 16 mg/dL (ref 6–20)
CALCIUM: 8.4 mg/dL — AB (ref 8.9–10.3)
CO2: 25 mmol/L (ref 22–32)
Chloride: 105 mmol/L (ref 101–111)
Creatinine, Ser: 0.45 mg/dL (ref 0.44–1.00)
Glucose, Bld: 120 mg/dL — ABNORMAL HIGH (ref 65–99)
POTASSIUM: 4.1 mmol/L (ref 3.5–5.1)
Sodium: 139 mmol/L (ref 135–145)
TOTAL PROTEIN: 6.8 g/dL (ref 6.5–8.1)

## 2015-11-26 LAB — HIV ANTIBODY (ROUTINE TESTING W REFLEX): HIV Screen 4th Generation wRfx: NONREACTIVE

## 2015-11-26 LAB — TROPONIN I

## 2015-11-26 MED ORDER — IPRATROPIUM-ALBUTEROL 0.5-2.5 (3) MG/3ML IN SOLN
3.0000 mL | RESPIRATORY_TRACT | Status: DC
  Administered 2015-11-27 – 2015-11-29 (×13): 3 mL via RESPIRATORY_TRACT
  Filled 2015-11-26 (×14): qty 3

## 2015-11-26 MED ORDER — OXYCODONE-ACETAMINOPHEN 7.5-325 MG PO TABS
1.0000 | ORAL_TABLET | Freq: Once | ORAL | Status: AC
  Administered 2015-11-26: 1 via ORAL
  Filled 2015-11-26: qty 1

## 2015-11-26 MED ORDER — OXYCODONE-ACETAMINOPHEN 7.5-325 MG PO TABS
1.0000 | ORAL_TABLET | Freq: Three times a day (TID) | ORAL | Status: DC
  Administered 2015-11-26 – 2015-11-29 (×10): 1 via ORAL
  Filled 2015-11-26 (×10): qty 1

## 2015-11-26 NOTE — Progress Notes (Signed)
PROGRESS NOTE    Laura Haynes  WUJ:[id number] DOB: 1970-12-23 DOA: 11/25/2015 PCP: [facility]     Brief Narrative:  45 year old woman admitted on 6/4 with complaints of shortness of breath. She has a history of sarcoidosis and was found to have infiltrates concerning for pneumonia. Admitted for further evaluation and management.   Assessment & Plan:   Principal Problem:   CAP (community acquired pneumonia) Active Problems:   Acute bronchitis   Sarcoidosis of lung (HCC)   Pneumonia   Community-acquired pneumonia -Continue Rocephin/azithromycin. -Culture data is pending.  Sarcoidosis -Is on stress dose steroids as she is chronically on prednisone.   DVT prophylaxis: Lovenox Code Status: Full code Family Communication: Husband James at bedside updated on plan of care and all questions answered Disposition Plan: Hope for discharge home in approximately 48 hours  Consultants:   None  Procedures:   None  Antimicrobials:   Rocephin  Azithromycin    Subjective: Still with significant shortness of breath, no chest pain  Objective: Filed Vitals:   11/26/15 0951 11/26/15 0955 11/26/15 1243 11/26/15 1501  BP:    125/88  Pulse:    92  Temp:    99.1 F (37.3 C)  TempSrc:    Oral  Resp:    20  Height:      Weight:      SpO2: 93% 95% 88% 89%    Intake/Output Summary (Last 24 hours) at 11/26/15 1513 Last data filed at 11/26/15 1019  Gross per 24 hour  Intake    240 ml  Output   1300 ml  Net  -1060 ml   Filed Weights   11/25/15 1050 11/25/15 1826  Weight: 53.524 kg (118 lb) 51.801 kg (114 lb 3.2 oz)    Examination:  General exam: Alert, awake, oriented x 3 Respiratory system: Coarse bilateral breath sounds Cardiovascular system:RRR. No murmurs, rubs, gallops. Gastrointestinal system: Abdomen is nondistended, soft and nontender. No organomegaly or masses felt. Normal bowel sounds heard. Central nervous system: Alert and  oriented. No focal neurological deficits. Extremities: No C/C/E, +pedal pulses Skin: No rashes, lesions or ulcers Psychiatry: Judgement and insight appear normal. Mood & affect appropriate.     Data Reviewed: I have personally reviewed following labs and imaging studies  CBC:  Recent Labs Lab 11/25/15 1115 11/26/15 0446  WBC 24.3* 27.4*  NEUTROABS 22.6* 23.9*  HGB 11.2* 10.8*  HCT 35.8* 35.5*  MCV 89.5 91.0  PLT 588* 598*   Basic Metabolic Panel:  Recent Labs Lab 11/25/15 1115 11/26/15 0446  NA 137 139  K 3.6 4.1  CL 106 105  CO2 23 25  GLUCOSE 112* 120*  BUN 15 16  CREATININE 0.52 0.45  CALCIUM 8.7* 8.4*   GFR: Estimated Creatinine Clearance: 71.9 mL/min (by C-G formula based on Cr of 0.45). Liver Function Tests:  Recent Labs Lab 11/26/15 0446  AST 21  ALT 10*  ALKPHOS 73  BILITOT 0.2*  PROT 6.8  ALBUMIN 3.4*   No results for input(s): LIPASE, AMYLASE in the last 168 hours. No results for input(s): AMMONIA in the last 168 hours. Coagulation Profile: No results for input(s): INR, PROTIME in the last 168 hours. Cardiac Enzymes:  Recent Labs Lab 11/25/15 1648 11/25/15 2233 11/26/15 0446  TROPONINI <0.03 <0.03 <0.03   BNP (last 3 results) No results for input(s): PROBNP in the last 8760 hours. HbA1C: No results for input(s): HGBA1C in the last 72 hours. CBG: No results for input(s): GLUCAP  in the last 168 hours. Lipid Profile: No results for input(s): CHOL, HDL, LDLCALC, TRIG, CHOLHDL, LDLDIRECT in the last 72 hours. Thyroid Function Tests: No results for input(s): TSH, T4TOTAL, FREET4, T3FREE, THYROIDAB in the last 72 hours. Anemia Panel: No results for input(s): VITAMINB12, FOLATE, FERRITIN, TIBC, IRON, RETICCTPCT in the last 72 hours. Urine analysis:    Component Value Date/Time   COLORURINE YELLOW 11/25/2015 1445   APPEARANCEUR CLEAR 11/25/2015 1445   LABSPEC <1.005* 11/25/2015 1445   PHURINE 5.0 11/25/2015 1445   GLUCOSEU NEGATIVE  11/25/2015 1445   HGBUR SMALL* 11/25/2015 1445   BILIRUBINUR NEGATIVE 11/25/2015 1445   KETONESUR NEGATIVE 11/25/2015 1445   PROTEINUR NEGATIVE 11/25/2015 1445   UROBILINOGEN 0.2 11/18/2013 2110   NITRITE NEGATIVE 11/25/2015 1445   LEUKOCYTESUR NEGATIVE 11/25/2015 1445   Sepsis Labs: @LABRCNTIP (procalcitonin:4,lacticidven:4)  )No results found for this or any previous visit (from the past 240 hour(s)).       Radiology Studies: Dg Chest 2 View  11/25/2015  CLINICAL DATA:  Shortness of breath and wheezing 4 days. History of asthma, COPD and sarcoid. EXAM: CHEST  2 VIEW COMPARISON:  01/26/2015 and prior radiographs FINDINGS: The cardiomediastinal silhouette is unremarkable. Mild patchy right lower lung opacities may represent infection or sarcoid changes Mild interstitial prominence bilaterally again noted. There is no evidence of pleural effusion or pneumothorax. Mild biapical scarring again noted. No acute bony abnormality identified. IMPRESSION: Mild patchy right lower lung opacities which may represent infection or sarcoid changes Mild chronic interstitial prominence. Electronically Signed   By: Harmon PierJeffrey  Hu M.D.   On: 11/25/2015 12:59   Ct Angio Chest Pe W/cm &/or Wo Cm  11/25/2015  CLINICAL DATA:  Shortness of breath for 4 days. Cough with fever. History of sarcoidosis EXAM: CT ANGIOGRAPHY CHEST WITH CONTRAST TECHNIQUE: Multidetector CT imaging of the chest was performed using the standard protocol during bolus administration of intravenous contrast. Multiplanar CT image reconstructions and MIPs were obtained to evaluate the vascular anatomy. CONTRAST:  100 mL Isovue 370 nonionic COMPARISON:  Chest CT Oct 25, 2014; chest radiograph November 25, 2015 FINDINGS: Mediastinum/Lymph Nodes: There is no demonstrable pulmonary embolus. There is no appreciable thoracic aortic aneurysm or dissection. The visualize great vessels appear normal. Pericardium is not appreciably thickened. Visualized thyroid appears  unremarkable. There are multiple prominent lymph nodes which were present on the previous study but better seen on this noncontrast enhanced study. There is a lymph node in the aortopulmonary window region measuring 2.4 x 1.3 cm. There is a lymph node anterior to the carina measuring 1.5 x 1.2 cm. There is a lymph node in the right hilum measuring 1.5 x 1.2 cm. There is a lymph node in the left hilum measuring 1.2 x 0.9 cm. There is a patchy fibrotic change in the upper lobes. In comparison with prior study, there is new patchy atelectasis in both upper lobes near the apices. There is ill-defined ground-glass type opacity in both upper lobes, not present on prior study. There is patchy airspace opacity in the anterior and posterior segments of the left upper lobe. There is also patchy airspace opacity in the posterior segment right upper lobe and in the superior segment of the right lower lobe. Will lower the There is a stable 4 mm nodular opacity in the superior segment of the left lower lobe. There is an irregular nodular opacity in the superior segment of the right lower lobe measuring 2.4 x 1.2 cm, stable. There is patchy atelectasis in both lung  bases. Note that there is a degree of underlying There is a sub- carinal lymph node measuring 1.3 x 1.1 cm. Lungs/Pleura: Can't be hips there are multiple patchy areas of airspace opacity not present previously, felt to represent multifocal pneumonia. Changes of this nature are noted in the upper lobes bilaterally involving portions of both anterior and posterior segments of the upper lobes. Patchy infiltrate is also noted in the superior segment of the right lower lobe, lateral segment right middle lobe, as well as in the superior, posterior, and lateral segments of the left lower lobe. There is a 4 mm nodular opacity in the superior segment left lower lobe which is stable. There is an irregular nodular opacity in the superior segment of the right lower lobe measuring  2.5 x 1.2 cm, stable. Patchy atelectasis is noted in both lower lobes as well. There are stable areas of patchy fibrosis in the upper lobes, unchanged from prior CT examination. Upper abdomen: In the visualized upper abdomen, no focal lesions are apparent. Musculoskeletal: There are no blastic or lytic bone lesions. There is lower thoracic levoscoliosis. Review of the MIP images confirms the above findings. IMPRESSION: No demonstrable pulmonary embolus. Patchy ground-glass type opacity bilaterally most likely represents multifocal pneumonia. Ground-glass opacity, new from prior CT examination, seen in multiple lobes in segments as summarized above. Nodular opacities, most notably in the superior segment right lower lobe, are stable may represent residua of sarcoidosis. Adenopathy is stable and felt to be secondary to sarcoidosis. There are fibrotic changes in each upper lobe which are stable, likely due to chronic sarcoidosis. Given the degree of ground-glass type opacity present, a follow-up CT an approximately 3 months is advised to assess for clearing. Conceivably, atypical neoplasia could present in this manner. Electronically Signed   By: Bretta Bang III M.D.   On: 11/25/2015 18:09        Scheduled Meds: . azithromycin  500 mg Oral Q24H  . budesonide (PULMICORT) nebulizer solution  0.25 mg Nebulization BID  . cefTRIAXone (ROCEPHIN)  IV  1 g Intravenous Q24H  . enoxaparin (LOVENOX) injection  40 mg Subcutaneous Q24H  . ipratropium-albuterol  3 mL Nebulization Q4H  . methylPREDNISolone (SOLU-MEDROL) injection  40 mg Intravenous Daily  . oxyCODONE-acetaminophen  1 tablet Oral TID  . pantoprazole  40 mg Oral Daily   Continuous Infusions: . sodium chloride 75 mL/hr at 11/26/15 1610        Time spent: 25 minutes. Greater than 50% of this time was spent in direct contact with the patient coordinating care.     Chaya Jan, MD [facility] Pager [phone]  If  7PM-7AM, please contact night-coverage [email or web address] Password [email or web address] 11/26/2015, 3:13 PM

## 2015-11-26 NOTE — Care Management Note (Signed)
Case Management Note  Patient Details  Name: Jetta LoutSherry L Ransdell MRN: [id number] Date of Birth: 11/03/1970  Subjective/Objective:    Spoke with patient for discharge planning. Patient is alert and answers questions appropriately Lives at home with her husband and drives self. Does not use any assistive devices to ambulate.     [facility]. No issues with filling medications.    No Cm needs anticipated.         Action/Plan:Home with Self care.   Expected Discharge Date:                  Expected Discharge Plan:  Home/Self Care  In-House Referral:     Discharge planning Services  CM Consult  Post Acute Care Choice:    Choice offered to:     DME Arranged:    DME Agency:     HH Arranged:    HH Agency:     Status of Service:  Completed, signed off  Medicare Important Message Given:    Date Medicare IM Given:    Medicare IM give by:    Date Additional Medicare IM Given:    Additional Medicare Important Message give by:     If discussed at Long Length of Stay Meetings, dates discussed:    Additional Comments:  Adonis HugueninBerkhead, Neoma Uhrich L, RN 11/26/2015, 4:23 PM

## 2015-11-27 LAB — STREP PNEUMONIAE URINARY ANTIGEN: Strep Pneumo Urinary Antigen: NEGATIVE

## 2015-11-27 LAB — LEGIONELLA PNEUMOPHILA SEROGP 1 UR AG: L. pneumophila Serogp 1 Ur Ag: NEGATIVE

## 2015-11-27 MED ORDER — PROMETHAZINE HCL 25 MG/ML IJ SOLN
12.5000 mg | Freq: Four times a day (QID) | INTRAMUSCULAR | Status: DC | PRN
  Administered 2015-11-27 – 2015-11-28 (×2): 12.5 mg via INTRAVENOUS
  Filled 2015-11-27 (×2): qty 1

## 2015-11-27 NOTE — Progress Notes (Addendum)
PROGRESS NOTE    Jetta LoutSherry L Cavallaro  ZOX:[id number] DOB: 04/29/1971 DOA: 11/25/2015 [facility]     Brief Narrative:  45 year old woman admitted on 6/4 with complaints of shortness of breath. She has a history of sarcoidosis and was found to have infiltrates concerning for pneumonia. Admitted for further evaluation and management.   Assessment & Plan:   Principal Problem:   CAP (community acquired pneumonia) Active Problems:   Acute bronchitis   Sarcoidosis of lung (HCC)   Pneumonia   Community-acquired pneumonia -Continue Rocephin/azithromycin. -Culture data is pending. Strep pneumo urine Ag is negative. -Improved altho still SOB especially with ambulation.  Sarcoidosis -Is on stress dose steroids as she is chronically on prednisone.  Leukocytosis -Due to CAP and chronic steroid use.   DVT prophylaxis: Lovenox Code Status: Full code Family Communication: Husband Fayrene FearingJames at bedside updated on plan of care and all questions answered on 6/5. Disposition Plan: Hope for discharge home in approximately 48 hours  Consultants:   None  Procedures:   None  Antimicrobials:   Rocephin  Azithromycin    Subjective: Still with significant shortness of breath, no chest pain  Objective: Filed Vitals:   11/27/15 0516 11/27/15 0833 11/27/15 0837 11/27/15 1241  BP: 119/71     Pulse: 96     Temp: 99.2 F (37.3 C)     TempSrc: Oral     Resp: 20     Height:      Weight:      SpO2: 90% 84% 94% 93%    Intake/Output Summary (Last 24 hours) at 11/27/15 1404 Last data filed at 11/27/15 1140  Gross per 24 hour  Intake 1331.25 ml  Output      0 ml  Net 1331.25 ml   Filed Weights   11/25/15 1050 11/25/15 1826  Weight: 53.524 kg (118 lb) 51.801 kg (114 lb 3.2 oz)    Examination:  General exam: Alert, awake, oriented x 3 Respiratory system: Coarse bilateral breath sounds Cardiovascular system:RRR. No murmurs, rubs,  gallops. Gastrointestinal system: Abdomen is nondistended, soft and nontender. No organomegaly or masses felt. Normal bowel sounds heard. Central nervous system: Alert and oriented. No focal neurological deficits. Extremities: No C/C/E, +pedal pulses Skin: No rashes, lesions or ulcers Psychiatry: Judgement and insight appear normal. Mood & affect appropriate.     Data Reviewed: I have personally reviewed following labs and imaging studies  CBC:  Recent Labs Lab 11/25/15 1115 11/26/15 0446  WBC 24.3* 27.4*  NEUTROABS 22.6* 23.9*  HGB 11.2* 10.8*  HCT 35.8* 35.5*  MCV 89.5 91.0  PLT 588* 598*   Basic Metabolic Panel:  Recent Labs Lab 11/25/15 1115 11/26/15 0446  NA 137 139  K 3.6 4.1  CL 106 105  CO2 23 25  GLUCOSE 112* 120*  BUN 15 16  CREATININE 0.52 0.45  CALCIUM 8.7* 8.4*   GFR: Estimated Creatinine Clearance: 71.9 mL/min (by C-G formula based on Cr of 0.45). Liver Function Tests:  Recent Labs Lab 11/26/15 0446  AST 21  ALT 10*  ALKPHOS 73  BILITOT 0.2*  PROT 6.8  ALBUMIN 3.4*   No results for input(s): LIPASE, AMYLASE in the last 168 hours. No results for input(s): AMMONIA in the last 168 hours. Coagulation Profile: No results for input(s): INR, PROTIME in the last 168 hours. Cardiac Enzymes:  Recent Labs Lab 11/25/15 1648 11/25/15 2233 11/26/15 0446  TROPONINI <0.03 <0.03 <0.03   BNP (last 3 results) No results for input(s):  PROBNP in the last 8760 hours. HbA1C: No results for input(s): HGBA1C in the last 72 hours. CBG: No results for input(s): GLUCAP in the last 168 hours. Lipid Profile: No results for input(s): CHOL, HDL, LDLCALC, TRIG, CHOLHDL, LDLDIRECT in the last 72 hours. Thyroid Function Tests: No results for input(s): TSH, T4TOTAL, FREET4, T3FREE, THYROIDAB in the last 72 hours. Anemia Panel: No results for input(s): VITAMINB12, FOLATE, FERRITIN, TIBC, IRON, RETICCTPCT in the last 72 hours. Urine analysis:    Component Value  Date/Time   COLORURINE YELLOW 11/25/2015 1445   APPEARANCEUR CLEAR 11/25/2015 1445   LABSPEC <1.005* 11/25/2015 1445   PHURINE 5.0 11/25/2015 1445   GLUCOSEU NEGATIVE 11/25/2015 1445   HGBUR SMALL* 11/25/2015 1445   BILIRUBINUR NEGATIVE 11/25/2015 1445   KETONESUR NEGATIVE 11/25/2015 1445   PROTEINUR NEGATIVE 11/25/2015 1445   UROBILINOGEN 0.2 11/18/2013 2110   NITRITE NEGATIVE 11/25/2015 1445   LEUKOCYTESUR NEGATIVE 11/25/2015 1445   Sepsis Labs: @LABRCNTIP (procalcitonin:4,lacticidven:4)  ) Recent Results (from the past 240 hour(s))  Urine culture     Status: Abnormal (Preliminary result)   Collection Time: 11/25/15  2:45 PM  Result Value Ref Range Status   Specimen Description URINE, CATHETERIZED  Final   Special Requests NONE  Final   Culture >=100,000 COLONIES/mL ESCHERICHIA COLI (A)  Final   Report Status PENDING  Incomplete         Radiology Studies: Ct Angio Chest Pe W/cm &/or Wo Cm  11/25/2015  CLINICAL DATA:  Shortness of breath for 4 days. Cough with fever. History of sarcoidosis EXAM: CT ANGIOGRAPHY CHEST WITH CONTRAST TECHNIQUE: Multidetector CT imaging of the chest was performed using the standard protocol during bolus administration of intravenous contrast. Multiplanar CT image reconstructions and MIPs were obtained to evaluate the vascular anatomy. CONTRAST:  100 mL Isovue 370 nonionic COMPARISON:  Chest CT Oct 25, 2014; chest radiograph November 25, 2015 FINDINGS: Mediastinum/Lymph Nodes: There is no demonstrable pulmonary embolus. There is no appreciable thoracic aortic aneurysm or dissection. The visualize great vessels appear normal. Pericardium is not appreciably thickened. Visualized thyroid appears unremarkable. There are multiple prominent lymph nodes which were present on the previous study but better seen on this noncontrast enhanced study. There is a lymph node in the aortopulmonary window region measuring 2.4 x 1.3 cm. There is a lymph node anterior to the carina  measuring 1.5 x 1.2 cm. There is a lymph node in the right hilum measuring 1.5 x 1.2 cm. There is a lymph node in the left hilum measuring 1.2 x 0.9 cm. There is a patchy fibrotic change in the upper lobes. In comparison with prior study, there is new patchy atelectasis in both upper lobes near the apices. There is ill-defined ground-glass type opacity in both upper lobes, not present on prior study. There is patchy airspace opacity in the anterior and posterior segments of the left upper lobe. There is also patchy airspace opacity in the posterior segment right upper lobe and in the superior segment of the right lower lobe. Will lower the There is a stable 4 mm nodular opacity in the superior segment of the left lower lobe. There is an irregular nodular opacity in the superior segment of the right lower lobe measuring 2.4 x 1.2 cm, stable. There is patchy atelectasis in both lung bases. Note that there is a degree of underlying There is a sub- carinal lymph node measuring 1.3 x 1.1 cm. Lungs/Pleura: Can't be hips there are multiple patchy areas of airspace opacity not present  previously, felt to represent multifocal pneumonia. Changes of this nature are noted in the upper lobes bilaterally involving portions of both anterior and posterior segments of the upper lobes. Patchy infiltrate is also noted in the superior segment of the right lower lobe, lateral segment right middle lobe, as well as in the superior, posterior, and lateral segments of the left lower lobe. There is a 4 mm nodular opacity in the superior segment left lower lobe which is stable. There is an irregular nodular opacity in the superior segment of the right lower lobe measuring 2.5 x 1.2 cm, stable. Patchy atelectasis is noted in both lower lobes as well. There are stable areas of patchy fibrosis in the upper lobes, unchanged from prior CT examination. Upper abdomen: In the visualized upper abdomen, no focal lesions are apparent. Musculoskeletal:  There are no blastic or lytic bone lesions. There is lower thoracic levoscoliosis. Review of the MIP images confirms the above findings. IMPRESSION: No demonstrable pulmonary embolus. Patchy ground-glass type opacity bilaterally most likely represents multifocal pneumonia. Ground-glass opacity, new from prior CT examination, seen in multiple lobes in segments as summarized above. Nodular opacities, most notably in the superior segment right lower lobe, are stable may represent residua of sarcoidosis. Adenopathy is stable and felt to be secondary to sarcoidosis. There are fibrotic changes in each upper lobe which are stable, likely due to chronic sarcoidosis. Given the degree of ground-glass type opacity present, a follow-up CT an approximately 3 months is advised to assess for clearing. Conceivably, atypical neoplasia could present in this manner. Electronically Signed   By: Bretta Bang III M.D.   On: 11/25/2015 18:09        Scheduled Meds: . azithromycin  500 mg Oral Q24H  . budesonide (PULMICORT) nebulizer solution  0.25 mg Nebulization BID  . cefTRIAXone (ROCEPHIN)  IV  1 g Intravenous Q24H  . enoxaparin (LOVENOX) injection  40 mg Subcutaneous Q24H  . ipratropium-albuterol  3 mL Nebulization Q4H WA  . methylPREDNISolone (SOLU-MEDROL) injection  40 mg Intravenous Daily  . oxyCODONE-acetaminophen  1 tablet Oral TID  . pantoprazole  40 mg Oral Daily   Continuous Infusions:     LOS: 1 day    Time spent: 25 minutes. Greater than 50% of this time was spent in direct contact with the patient coordinating care.     Chaya Jan, MD [facility] Pager [phone]  If 7PM-7AM, please contact night-coverage [email or web address] Password [email or web address] 11/27/2015, 2:04 PM

## 2015-11-28 DIAGNOSIS — J189 Pneumonia, unspecified organism: Secondary | ICD-10-CM

## 2015-11-28 DIAGNOSIS — D860 Sarcoidosis of lung: Secondary | ICD-10-CM

## 2015-11-28 LAB — URINE CULTURE: Culture: >=100000 — AB

## 2015-11-28 LAB — CBC
HCT: 34.5 % — ABNORMAL LOW (ref 36.0–46.0)
Hemoglobin: 10.7 g/dL — ABNORMAL LOW (ref 12.0–15.0)
MCH: 27.9 pg (ref 26.0–34.0)
MCHC: 31 g/dL (ref 30.0–36.0)
MCV: 90.1 fL (ref 78.0–100.0)
PLATELETS: 573 10*3/uL — AB (ref 150–400)
RBC: 3.83 MIL/uL — ABNORMAL LOW (ref 3.87–5.11)
RDW: 14.1 % (ref 11.5–15.5)
WBC: 20.4 10*3/uL — AB (ref 4.0–10.5)

## 2015-11-28 LAB — BASIC METABOLIC PANEL
ANION GAP: 8 (ref 5–15)
BUN: 12 mg/dL (ref 6–20)
CALCIUM: 8.8 mg/dL — AB (ref 8.9–10.3)
CO2: 28 mmol/L (ref 22–32)
CREATININE: 0.41 mg/dL — AB (ref 0.44–1.00)
Chloride: 103 mmol/L (ref 101–111)
GLUCOSE: 78 mg/dL (ref 65–99)
POTASSIUM: 3.5 mmol/L (ref 3.5–5.1)
Sodium: 139 mmol/L (ref 135–145)

## 2015-11-28 MED ORDER — CEFUROXIME AXETIL 250 MG PO TABS
500.0000 mg | ORAL_TABLET | Freq: Two times a day (BID) | ORAL | Status: DC
Start: 2015-11-28 — End: 2015-11-29
  Administered 2015-11-28 – 2015-11-29 (×2): 500 mg via ORAL
  Filled 2015-11-28 (×2): qty 2

## 2015-11-28 NOTE — Progress Notes (Signed)
  PROGRESS NOTE  Laura LoutSherry L Haynes ZOX:[id number] DOB: 08/02/1970 DOA: 11/25/2015 [facility]  Brief Narrative: [age] yof with history of sarcoidosis presented with complaints of SOB. Admitted or suspected pneumonia.   Assessment/Plan: 1. Community-acquired pneumonia with associated acute hypoxic respiratory failure. Slowly improving. Reports has oxygen at home used as needed.  2. Sarcoidosis on chronic steroids   Overall seems to be making improvement. Transition to oral abx.   Anticipate discharge home 6/8 if continues to improve.  DVT prophylaxis: Lovenox Code Status: Full Family Communication: none Disposition Plan: home  Brendia Sacksaniel Cathlyn Tersigni, MD  [facility] Direct contact: [phone] --Via amion app OR  --[email or web address]; password [email or web address]  7PM-7AM contact night coverage as above 11/28/2015, 9:36 AM  LOS: 2 days   Consultants:    Procedures:    Antimicrobials:  Rocephin 6/5>>6/12  Azithromycin 6/5>>6/12  HPI/Subjective: Overall feels somewhat better although still short of breath.  Objective: Filed Vitals:   11/27/15 2209 11/28/15 0500 11/28/15 0539 11/28/15 0728  BP: 113/68 135/78    Pulse: 80 90    Temp: 98.6 F (37 C) 99.5 F (37.5 C)    TempSrc: Oral Oral    Resp: 16     Height:      Weight:      SpO2: 93% 89% 96% 86%    Intake/Output Summary (Last 24 hours) at 11/28/15 0936 Last data filed at 11/27/15 1711  Gross per 24 hour  Intake    120 ml  Output      0 ml  Net    120 ml     Filed Weights   11/25/15 1050 11/25/15 1826  Weight: 53.524 kg (118 lb) 51.801 kg (114 lb 3.2 oz)    Exam:    Constitutional:  . Appears calm and comfortable Respiratory:  . CTA bilaterally, no w/r/r.  . Respiratory effort normal. No retractions or accessory muscle use Cardiovascular:  . RRR, no m/r/g . No LE extremity edema   . Telemetry ST Psychiatric:  . judgement and insight appear normal . Mental status o Mood, affect  appropriate  I have personally reviewed following labs and imaging studies:  Basic metabolic panel unremarkable  WBC improved, 20.4  Hemoglobin stable 10.7  Scheduled Meds: . azithromycin  500 mg Oral Q24H  . budesonide (PULMICORT) nebulizer solution  0.25 mg Nebulization BID  . cefTRIAXone (ROCEPHIN)  IV  1 g Intravenous Q24H  . enoxaparin (LOVENOX) injection  40 mg Subcutaneous Q24H  . ipratropium-albuterol  3 mL Nebulization Q4H WA  . methylPREDNISolone (SOLU-MEDROL) injection  40 mg Intravenous Daily  . oxyCODONE-acetaminophen  1 tablet Oral TID  . pantoprazole  40 mg Oral Daily   Continuous Infusions:   Principal Problem:   CAP (community acquired pneumonia) Active Problems:   Acute bronchitis   Sarcoidosis of lung (HCC)   Pneumonia   LOS: 2 days   Time spent 20 minutes     By signing my name below, I, Zadie CleverlyJessica Augustus attest that this documentation has been prepared under the direction and in the presence of Brendia Sacksaniel Derriona Branscom, MD Electronically signed: Zadie CleverlyJessica Augustus 11/28/2015   I personally performed the services described in this documentation. All medical record entries made by the scribe were at my direction. I have reviewed the chart and agree that the record reflects my personal performance and is accurate and complete. Brendia Sacksaniel Shadee Rathod, MD

## 2015-11-28 NOTE — Progress Notes (Signed)
Patient keeps taking her oxygen off even after being told the importance of keeping it on and how it effects her oxygen saturation.  Patient put the oxygen back on for now.  Will continue to monitor.

## 2015-11-28 NOTE — Discharge Summary (Signed)
Physician Discharge Summary  FELESHIA ZUNDEL WUJ:[id number] DOB: [birth year] DOA: 11/25/2015  PCP: [facility]  Admit date: 11/25/2015 Discharge date: 11/29/2015  Recommendations for Outpatient Follow-up:  1. Follow-up resolution of pneumonia. 2. Consider repeat CT chest in 3 months to ensure clearing. See CT report below.   Follow-up Information    Follow up with [facility] In 1 week.   Contact information:   [address] [place] [address] [phone]      Discharge Diagnoses:  1. Community-acquired pneumonia.  2. Acute on chronic hypoxic respiratory failure. 3. Sarcoidosis.   Discharge Condition: Improved Disposition: Discharge home  Diet recommendation: Heart healthy    Filed Weights   11/25/15 1050 11/25/15 1826  Weight: 53.524 kg (118 lb) 51.801 kg (114 lb 3.2 oz)    History of present illness:  68 yof with history of sarcoidosis presented with complaints of SOB. Admitted for pneumonia.  Hospital Course:  Treated empirically for pneumonia with antibiotics with gradual clinical improvement. At this time respiratory status remained stable, stable for discharge. Plan steroid taper back to chronic dosing and completion of outpatient antibiotics. Hospitalization was uncomplicated.  Individual issues as below:  1. Community-acquired pneumonia with associated acute hypoxic respiratory failure. Continues to improve. 2. Chronic hypoxic respiratory failure. She is oxygen as needed as home. 3. Sarcoidosis on chronic steroid  Consultants:  4. None  Procedures:   None  Discharge Instructions  Discharge Instructions    Diet general    Complete by:  As directed      Discharge instructions    Complete by:  As directed   Call your physician or seek immediate medical attention for fever, shortness of breath, pain, weakness or worsening of condition.     Increase activity slowly    Complete by:  As directed           Discharge Medication List as of 11/29/2015  3:01 PM    START taking these medications   Details  cefUROXime (CEFTIN) 500 MG tablet Take 1 tablet (500 mg total) by mouth 2 (two) times daily with a meal., Starting 11/29/2015, Until Discontinued, Normal      CONTINUE these medications which have CHANGED   Details  predniSONE (DELTASONE) 20 MG tablet Take 2 tablets daily 6/9-6/11 then resume chronic 20 mg PO daily., Normal      CONTINUE these medications which have NOT CHANGED   Details  albuterol (PROVENTIL HFA;VENTOLIN HFA) 108 (90 BASE) MCG/ACT inhaler Inhale 2 puffs into the lungs every 6 (six) hours as needed for wheezing or shortness of breath., Until Discontinued, Historical Med    albuterol (PROVENTIL) (2.5 MG/3ML) 0.083% nebulizer solution Take 2.5 mg by nebulization every 6 (six) hours as needed for wheezing., Until Discontinued, Historical Med    dexlansoprazole (DEXILANT) 60 MG capsule Take 1 capsule (60 mg total) by mouth daily., Starting 10/18/2013, Until Discontinued, Normal    oxyCODONE-acetaminophen (PERCOCET) 7.5-325 MG tablet Take 1 tablet by mouth 3 (three) times daily., Until Discontinued, Historical Med    SAPHRIS 5 MG SUBL 24 hr tablet Place 5 mg under the tongue 2 (two) times daily., Starting 06/08/2014, Until Discontinued, Historical Med      STOP taking these medications     glucose blood (ACCU-CHEK AVIVA) test strip      LINZESS 290 MCG CAPS capsule        Allergies  Allergen Reactions  . Coreg [Carvedilol] Other (See Comments)    Hair  loss/mood swings  . Darvocet [Propoxyphene N-Acetaminophen] Other (See Comments)    blisters  . Morphine And Related Other (See Comments)    blisters  . Motrin [Ibuprofen] Other (See Comments)    blisters  . Phenobarbital Other (See Comments)    Child hood allergy    The results of significant diagnostics from this hospitalization (including imaging, microbiology, ancillary and laboratory) are listed below for  reference.    Significant Diagnostic Studies: Dg Chest 2 View  11/25/2015  CLINICAL DATA:  Shortness of breath and wheezing 4 days. History of asthma, COPD and sarcoid. EXAM: CHEST  2 VIEW COMPARISON:  01/26/2015 and prior radiographs FINDINGS: The cardiomediastinal silhouette is unremarkable. Mild patchy right lower lung opacities may represent infection or sarcoid changes Mild interstitial prominence bilaterally again noted. There is no evidence of pleural effusion or pneumothorax. Mild biapical scarring again noted. No acute bony abnormality identified. IMPRESSION: Mild patchy right lower lung opacities which may represent infection or sarcoid changes Mild chronic interstitial prominence. Electronically Signed   By: Harmon Pier M.D.   On: 11/25/2015 12:59   Ct Angio Chest Pe W/cm &/or Wo Cm  11/25/2015  CLINICAL DATA:  Shortness of breath for 4 days. Cough with fever. History of sarcoidosis EXAM: CT ANGIOGRAPHY CHEST WITH CONTRAST TECHNIQUE: Multidetector CT imaging of the chest was performed using the standard protocol during bolus administration of intravenous contrast. Multiplanar CT image reconstructions and MIPs were obtained to evaluate the vascular anatomy. CONTRAST:  100 mL Isovue 370 nonionic COMPARISON:  Chest CT Oct 25, 2014; chest radiograph November 25, 2015 FINDINGS: Mediastinum/Lymph Nodes: There is no demonstrable pulmonary embolus. There is no appreciable thoracic aortic aneurysm or dissection. The visualize great vessels appear normal. Pericardium is not appreciably thickened. Visualized thyroid appears unremarkable. There are multiple prominent lymph nodes which were present on the previous study but better seen on this noncontrast enhanced study. There is a lymph node in the aortopulmonary window region measuring 2.4 x 1.3 cm. There is a lymph node anterior to the carina measuring 1.5 x 1.2 cm. There is a lymph node in the right hilum measuring 1.5 x 1.2 cm. There is a lymph node in the left  hilum measuring 1.2 x 0.9 cm. There is a patchy fibrotic change in the upper lobes. In comparison with prior study, there is new patchy atelectasis in both upper lobes near the apices. There is ill-defined ground-glass type opacity in both upper lobes, not present on prior study. There is patchy airspace opacity in the anterior and posterior segments of the left upper lobe. There is also patchy airspace opacity in the posterior segment right upper lobe and in the superior segment of the right lower lobe. Will lower the There is a stable 4 mm nodular opacity in the superior segment of the left lower lobe. There is an irregular nodular opacity in the superior segment of the right lower lobe measuring 2.4 x 1.2 cm, stable. There is patchy atelectasis in both lung bases. Note that there is a degree of underlying There is a sub- carinal lymph node measuring 1.3 x 1.1 cm. Lungs/Pleura: Can't be hips there are multiple patchy areas of airspace opacity not present previously, felt to represent multifocal pneumonia. Changes of this nature are noted in the upper lobes bilaterally involving portions of both anterior and posterior segments of the upper lobes. Patchy infiltrate is also noted in the superior segment of the right lower lobe, lateral segment right middle lobe, as well as in  the superior, posterior, and lateral segments of the left lower lobe. There is a 4 mm nodular opacity in the superior segment left lower lobe which is stable. There is an irregular nodular opacity in the superior segment of the right lower lobe measuring 2.5 x 1.2 cm, stable. Patchy atelectasis is noted in both lower lobes as well. There are stable areas of patchy fibrosis in the upper lobes, unchanged from prior CT examination. Upper abdomen: In the visualized upper abdomen, no focal lesions are apparent. Musculoskeletal: There are no blastic or lytic bone lesions. There is lower thoracic levoscoliosis. Review of the MIP images confirms the  above findings. IMPRESSION: No demonstrable pulmonary embolus. Patchy ground-glass type opacity bilaterally most likely represents multifocal pneumonia. Ground-glass opacity, new from prior CT examination, seen in multiple lobes in segments as summarized above. Nodular opacities, most notably in the superior segment right lower lobe, are stable may represent residua of sarcoidosis. Adenopathy is stable and felt to be secondary to sarcoidosis. There are fibrotic changes in each upper lobe which are stable, likely due to chronic sarcoidosis. Given the degree of ground-glass type opacity present, a follow-up CT an approximately 3 months is advised to assess for clearing. Conceivably, atypical neoplasia could present in this manner. Electronically Signed   By: Bretta Bang III M.D.   On: 11/25/2015 18:09    Microbiology: Recent Results (from the past 240 hour(s))  Urine culture     Status: Abnormal   Collection Time: 11/25/15  2:45 PM  Result Value Ref Range Status   Specimen Description URINE, CATHETERIZED  Final   Special Requests NONE  Final   Culture >=100,000 COLONIES/mL ESCHERICHIA COLI (A)  Final   Report Status 11/28/2015 FINAL  Final   Organism ID, Bacteria ESCHERICHIA COLI (A)  Final      Susceptibility   Escherichia coli - MIC*    AMPICILLIN >=32 RESISTANT Resistant     CEFAZOLIN <=4 SENSITIVE Sensitive     CEFTRIAXONE <=1 SENSITIVE Sensitive     CIPROFLOXACIN <=0.25 SENSITIVE Sensitive     GENTAMICIN <=1 SENSITIVE Sensitive     IMIPENEM <=0.25 SENSITIVE Sensitive     NITROFURANTOIN <=16 SENSITIVE Sensitive     TRIMETH/SULFA <=20 SENSITIVE Sensitive     AMPICILLIN/SULBACTAM 8 SENSITIVE Sensitive     PIP/TAZO <=4 SENSITIVE Sensitive     * >=100,000 COLONIES/mL ESCHERICHIA COLI     Labs: Basic Metabolic Panel:  Recent Labs Lab 11/25/15 1115 11/26/15 0446 11/28/15 0507  NA 137 139 139  K 3.6 4.1 3.5  CL 106 105 103  CO2 GLUCOSE 112* 120* 78  BUN CREATININE 0.52 0.45 0.41*  CALCIUM 8.7* 8.4* 8.8*   Liver Function Tests:  Recent Labs Lab 11/26/15 0446  AST 21  ALT 10*  ALKPHOS 73  BILITOT 0.2*  PROT 6.8  ALBUMIN 3.4*    Recent Labs Lab 11/25/15 1115 11/26/15 0446 11/28/15 0507  WBC 24.3* 27.4* 20.4*  NEUTROABS 22.6* 23.9*  --   HGB 11.2* 10.8* 10.7*  HCT 35.8* 35.5* 34.5*  MCV 89.5 91.0 90.1  PLT 588* 598* 573*   Cardiac Enzymes:  Recent Labs Lab 11/25/15 1648 11/25/15 2233 11/26/15 0446  TROPONINI <0.03 <0.03 <0.03   Principal Problem:   CAP (community acquired pneumonia) Active Problems:   Acute bronchitis   Sarcoidosis of lung (HCC)   Pneumonia   Time coordinating discharge: 35 minutes  Signed:  Brendia Sacks, MD [facility] 11/29/2015,  6:42 PM    By signing my name below, I, Zadie CleverlyJessica Augustus attest that this documentation has been prepared under the direction and in the presence of Brendia Sacksaniel Goodrich, MD Electronically signed: Zadie CleverlyJessica Augustus 11/29/2015 1:12pm   I personally performed the services described in this documentation. All medical record entries made by the scribe were at my direction. I have reviewed the chart and agree that the record reflects my personal performance and is accurate and complete. Brendia Sacksaniel Goodrich, MD

## 2015-11-28 NOTE — Progress Notes (Signed)
SATURATION QUALIFICATIONS: (This note is used to comply with regulatory documentation for home oxygen)  Patient Saturations on Room Air at Rest = 90%  Patient Saturations on Room Air while Ambulating = 86%  Patient Saturations on 2 Liters of oxygen while Ambulating = 90%  

## 2015-11-29 MED ORDER — CEFUROXIME AXETIL 500 MG PO TABS: 500.0000 mg | ORAL_TABLET | Freq: Two times a day (BID) | ORAL | Status: DC

## 2015-11-29 MED ORDER — PREDNISONE 20 MG PO TABS: ORAL_TABLET | ORAL | Status: DC

## 2015-11-29 NOTE — Care Management (Signed)
Patient up for discharge today, States feeling much better today. Patient has home O2 and portable tank. Currently on room air. Husband present in room. No other needs identified.

## 2015-11-29 NOTE — Progress Notes (Signed)
Pt o2 SpO2 at 86% on room air. Pt continue to take o2 off I explain to her the importance of her keeping it on she states "nose sore and pouring blood day before yesterday"

## 2015-11-29 NOTE — Progress Notes (Signed)
  PROGRESS NOTE  Jetta LoutSherry L Divito WGN:[id number] DOB: 06/18/1971 DOA: 11/25/2015 [facility]  Brief Narrative: [age] yof with history of sarcoidosis presented with complaints of SOB. Admitted for pneumonia.  Assessment/Plan: 1. Community-acquired pneumonia with associated acute hypoxic respiratory failure. Continues to improve. 2. Chronic hypoxic respiratory failure. She is oxygen as needed as home. 3. Sarcoidosis on chronic steroid    Overall improving. Home today on steroid taper and antibiotics.  DVT prophylaxis: Lovenox Code Status: Full Family Communication: none Disposition Plan: Discharge home today.   Brendia Sacksaniel Perina Salvaggio, MD  [facility] Direct contact: [phone] --Via amion app OR  --[email or web address]; password [email or web address]  7PM-7AM contact night coverage as above 11/29/2015, 7:16 AM  LOS: 3 days   Consultants:  None  Procedures:  None  Antimicrobials:  Rocephin 6/5>>6/08  Azithromycin 6/5>>6/08  Ceftin 06/08>>06/12  HPI/Subjective: Breathing is improved with breathing treatment. Was able to eat and drink without difficulty.   Objective: Filed Vitals:   11/28/15 2155 11/28/15 2322 11/29/15 0348 11/29/15 0538  BP: 108/72   109/64  Pulse: 105   97  Temp: 98.9 F (37.2 C)   99.1 F (37.3 C)  TempSrc: Oral   Oral  Resp: 18     Height:      Weight:      SpO2: 91% 88% 86% 95%    Intake/Output Summary (Last 24 hours) at 11/29/15 0716 Last data filed at 11/28/15 1758  Gross per 24 hour  Intake    600 ml  Output      0 ml  Net    600 ml     Filed Weights   11/25/15 1050 11/25/15 1826  Weight: 53.524 kg (118 lb) 51.801 kg (114 lb 3.2 oz)    Exam:    Constitutional:  . Appears calm and comfortable Respiratory:  . Some wheezes, few rhonchi no rales.  Marland Kitchen. Respiratory effort normal. Able to speak in full sentences. . No retractions or accessory muscle use Cardiovascular:  . RRR, no m/r/g  Scheduled Meds: . azithromycin   500 mg Oral Q24H  . budesonide (PULMICORT) nebulizer solution  0.25 mg Nebulization BID  . cefUROXime  500 mg Oral BID WC  . enoxaparin (LOVENOX) injection  40 mg Subcutaneous Q24H  . ipratropium-albuterol  3 mL Nebulization Q4H WA  . methylPREDNISolone (SOLU-MEDROL) injection  40 mg Intravenous Daily  . oxyCODONE-acetaminophen  1 tablet Oral TID  . pantoprazole  40 mg Oral Daily   Continuous Infusions:   Principal Problem:   CAP (community acquired pneumonia) Active Problems:   Acute bronchitis   Sarcoidosis of lung (HCC)   Pneumonia   LOS: 3 days    By signing my name below, I, Zadie CleverlyJessica Augustus attest that this documentation has been prepared under the direction and in the presence of Brendia Sacksaniel Emric Kowalewski, MD Electronically signed: Zadie CleverlyJessica Augustus 11/29/2015 1:12pm   I personally performed the services described in this documentation. All medical record entries made by the scribe were at my direction. I have reviewed the chart and agree that the record reflects my personal performance and is accurate and complete. Brendia Sacksaniel Tilly Pernice, MD

## 2015-11-29 NOTE — Progress Notes (Signed)
Pt's IV catheter removed and intact. Pt's IV site clean dry and intact. Discharge instructions including medications and follow up appointments were reviewed and discussed with patient. Pt verbalized understanding of discharge instructions including medications and follow up appointments. Pt in stable condition and in no acute distress at time of discharge. Pt escorted by nurse tech.

## 2015-11-30 ENCOUNTER — Ambulatory Visit: Payer: Medicaid Other | Admitting: "Endocrinology

## 2023-02-22 DEATH — deceased

## 2024-07-04 ENCOUNTER — Telehealth: Payer: Self-pay

## 2024-07-04 NOTE — Telephone Encounter (Signed)
 Called the pt in regards to her FMLA/Disability for. Was unable to leave a message.
